# Patient Record
Sex: Male | Born: 1965
Health system: Southern US, Community
[De-identification: ages and names within clinical notes are randomized; demographics above are authoritative.]

## PROBLEM LIST (undated history)

## (undated) DIAGNOSIS — K219 Gastro-esophageal reflux disease without esophagitis: Secondary | ICD-10-CM

## (undated) DIAGNOSIS — I10 Essential (primary) hypertension: Secondary | ICD-10-CM

## (undated) DIAGNOSIS — E669 Obesity, unspecified: Secondary | ICD-10-CM

## (undated) HISTORY — PX: LAPAROSCOPIC GASTRIC SLEEVE RESECTION: SHX5895

## (undated) HISTORY — PX: TONSILECTOMY, ADENOIDECTOMY, BILATERAL MYRINGOTOMY AND TUBES: SHX2538

## (undated) HISTORY — DX: Obesity, unspecified: E66.9

## (undated) HISTORY — DX: Essential (primary) hypertension: I10

---

## 2017-01-06 DIAGNOSIS — Z131 Encounter for screening for diabetes mellitus: Secondary | ICD-10-CM | POA: Diagnosis not present

## 2017-01-06 DIAGNOSIS — Z23 Encounter for immunization: Secondary | ICD-10-CM | POA: Diagnosis not present

## 2017-01-06 DIAGNOSIS — Z1211 Encounter for screening for malignant neoplasm of colon: Secondary | ICD-10-CM | POA: Diagnosis not present

## 2017-01-06 DIAGNOSIS — Z0000 Encounter for general adult medical examination without abnormal findings: Secondary | ICD-10-CM | POA: Diagnosis not present

## 2017-01-06 DIAGNOSIS — Z125 Encounter for screening for malignant neoplasm of prostate: Secondary | ICD-10-CM | POA: Diagnosis not present

## 2017-01-27 DIAGNOSIS — D485 Neoplasm of uncertain behavior of skin: Secondary | ICD-10-CM | POA: Diagnosis not present

## 2017-01-27 DIAGNOSIS — D225 Melanocytic nevi of trunk: Secondary | ICD-10-CM | POA: Diagnosis not present

## 2017-11-11 DIAGNOSIS — K080 Exfoliation of teeth due to systemic causes: Secondary | ICD-10-CM | POA: Diagnosis not present

## 2017-11-16 DIAGNOSIS — K080 Exfoliation of teeth due to systemic causes: Secondary | ICD-10-CM | POA: Diagnosis not present

## 2018-04-10 DIAGNOSIS — E669 Obesity, unspecified: Secondary | ICD-10-CM | POA: Diagnosis not present

## 2018-04-10 DIAGNOSIS — Z131 Encounter for screening for diabetes mellitus: Secondary | ICD-10-CM | POA: Diagnosis not present

## 2018-04-10 DIAGNOSIS — Z0000 Encounter for general adult medical examination without abnormal findings: Secondary | ICD-10-CM | POA: Diagnosis not present

## 2018-04-10 DIAGNOSIS — Z125 Encounter for screening for malignant neoplasm of prostate: Secondary | ICD-10-CM | POA: Diagnosis not present

## 2018-04-10 DIAGNOSIS — Z13220 Encounter for screening for lipoid disorders: Secondary | ICD-10-CM | POA: Diagnosis not present

## 2018-04-26 DIAGNOSIS — K921 Melena: Secondary | ICD-10-CM | POA: Diagnosis not present

## 2018-05-12 DIAGNOSIS — K080 Exfoliation of teeth due to systemic causes: Secondary | ICD-10-CM | POA: Diagnosis not present

## 2018-06-06 DIAGNOSIS — K219 Gastro-esophageal reflux disease without esophagitis: Secondary | ICD-10-CM | POA: Diagnosis not present

## 2018-06-06 DIAGNOSIS — Z1211 Encounter for screening for malignant neoplasm of colon: Secondary | ICD-10-CM | POA: Diagnosis not present

## 2018-06-06 DIAGNOSIS — K259 Gastric ulcer, unspecified as acute or chronic, without hemorrhage or perforation: Secondary | ICD-10-CM | POA: Diagnosis not present

## 2018-06-06 DIAGNOSIS — Z9884 Bariatric surgery status: Secondary | ICD-10-CM | POA: Diagnosis not present

## 2018-06-06 DIAGNOSIS — K449 Diaphragmatic hernia without obstruction or gangrene: Secondary | ICD-10-CM | POA: Diagnosis not present

## 2018-06-06 DIAGNOSIS — K208 Other esophagitis: Secondary | ICD-10-CM | POA: Diagnosis not present

## 2018-06-06 DIAGNOSIS — K210 Gastro-esophageal reflux disease with esophagitis: Secondary | ICD-10-CM | POA: Diagnosis not present

## 2018-06-06 DIAGNOSIS — K2970 Gastritis, unspecified, without bleeding: Secondary | ICD-10-CM | POA: Diagnosis not present

## 2018-06-06 DIAGNOSIS — K5730 Diverticulosis of large intestine without perforation or abscess without bleeding: Secondary | ICD-10-CM | POA: Diagnosis not present

## 2018-06-06 DIAGNOSIS — K2900 Acute gastritis without bleeding: Secondary | ICD-10-CM | POA: Diagnosis not present

## 2018-06-06 LAB — HM COLONOSCOPY

## 2018-07-19 DIAGNOSIS — K219 Gastro-esophageal reflux disease without esophagitis: Secondary | ICD-10-CM | POA: Diagnosis not present

## 2018-12-13 DIAGNOSIS — Z23 Encounter for immunization: Secondary | ICD-10-CM | POA: Diagnosis not present

## 2019-05-28 DIAGNOSIS — R05 Cough: Secondary | ICD-10-CM | POA: Diagnosis not present

## 2019-05-28 DIAGNOSIS — R509 Fever, unspecified: Secondary | ICD-10-CM | POA: Diagnosis not present

## 2019-05-28 DIAGNOSIS — J189 Pneumonia, unspecified organism: Secondary | ICD-10-CM | POA: Diagnosis not present

## 2019-08-02 DIAGNOSIS — M461 Sacroiliitis, not elsewhere classified: Secondary | ICD-10-CM | POA: Diagnosis not present

## 2019-08-02 DIAGNOSIS — M9905 Segmental and somatic dysfunction of pelvic region: Secondary | ICD-10-CM | POA: Diagnosis not present

## 2019-08-02 DIAGNOSIS — M9903 Segmental and somatic dysfunction of lumbar region: Secondary | ICD-10-CM | POA: Diagnosis not present

## 2019-08-02 DIAGNOSIS — M5387 Other specified dorsopathies, lumbosacral region: Secondary | ICD-10-CM | POA: Diagnosis not present

## 2019-08-09 DIAGNOSIS — M5387 Other specified dorsopathies, lumbosacral region: Secondary | ICD-10-CM | POA: Diagnosis not present

## 2019-08-09 DIAGNOSIS — M9905 Segmental and somatic dysfunction of pelvic region: Secondary | ICD-10-CM | POA: Diagnosis not present

## 2019-08-09 DIAGNOSIS — M461 Sacroiliitis, not elsewhere classified: Secondary | ICD-10-CM | POA: Diagnosis not present

## 2019-08-09 DIAGNOSIS — M9903 Segmental and somatic dysfunction of lumbar region: Secondary | ICD-10-CM | POA: Diagnosis not present

## 2019-12-27 ENCOUNTER — Encounter: Payer: Self-pay | Admitting: Legal Medicine

## 2019-12-27 ENCOUNTER — Ambulatory Visit: Payer: Federal, State, Local not specified - PPO | Admitting: Legal Medicine

## 2019-12-27 ENCOUNTER — Ambulatory Visit: Payer: Self-pay | Admitting: Family Medicine

## 2019-12-27 ENCOUNTER — Other Ambulatory Visit: Payer: Self-pay

## 2019-12-27 VITALS — BP 180/100 | HR 81 | Temp 97.4°F | Ht 69.29 in | Wt 227.0 lb

## 2019-12-27 DIAGNOSIS — Z6841 Body Mass Index (BMI) 40.0 and over, adult: Secondary | ICD-10-CM

## 2019-12-27 DIAGNOSIS — Z0000 Encounter for general adult medical examination without abnormal findings: Secondary | ICD-10-CM | POA: Insufficient documentation

## 2019-12-27 DIAGNOSIS — I10 Essential (primary) hypertension: Secondary | ICD-10-CM

## 2019-12-27 DIAGNOSIS — E291 Testicular hypofunction: Secondary | ICD-10-CM | POA: Diagnosis not present

## 2019-12-27 MED ORDER — LISINOPRIL 20 MG PO TABS: 20.0000 mg | ORAL_TABLET | Freq: Every day | ORAL | 3 refills | Status: DC

## 2019-12-27 NOTE — Assessment & Plan Note (Signed)

## 2019-12-27 NOTE — Patient Instructions (Signed)

## 2019-12-27 NOTE — Progress Notes (Signed)
Established Patient Office Visit  Subjective:  Patient ID: Tyrone Wilson, male    DOB: [birth year]  Age: 54 y.o. MRN: [id number]  CC:  Chief Complaint  Patient presents with  . Annual Exam    HPI Tyrone Wilson presents for CPE.  Physical: patient has a long history of hypertension and is off medicines.   He is having some dizziness.  No headaches.  No CAD or chest pain, no stoke.  He has gastric sleeve for obesity.  He had pneumonia in the past and had GI bleed from esophagus.  He is in Event organiser.  No hospitalization. Retired.  Past Medical History:  Diagnosis Date  . Hypertension   . Obesity     Past Surgical History:  Procedure Laterality Date  . LAPAROSCOPIC GASTRIC SLEEVE RESECTION    . TONSILECTOMY, ADENOIDECTOMY, BILATERAL MYRINGOTOMY AND TUBES      Family History  Problem Relation Age of Onset  . Hypertension Mother   . Diabetes Mother   . Hypertension Father   . Cancer Father   . Cancer Brother     Social History   Socioeconomic History  . Marital status: Married    Spouse name: Not on file  . Number of children: Not on file  . Years of education: Not on file  . Highest education level: Not on file  Occupational History  . Occupation: retired  Tobacco Use  . Smoking status: Never Smoker  . Smokeless tobacco: Never Used  Substance and Sexual Activity  . Alcohol use: Yes    Comment: very little  . Drug use: Never  . Sexual activity: Not on file  Other Topics Concern  . Not on file  Social History Narrative  . Not on file   Social Determinants of Health   Financial Resource Strain:   . Difficulty of Paying Living Expenses:   Food Insecurity:   . Worried About Charity fundraiser in the Last Year:   . Arboriculturist in the Last Year:   Transportation Needs:   . Film/video editor (Medical):   Marland Kitchen Lack of Transportation (Non-Medical):   Physical Activity:   . Days of Exercise per Week:   . Minutes of Exercise per Session:     Stress:   . Feeling of Stress :   Social Connections:   . Frequency of Communication with Friends and Family:   . Frequency of Social Gatherings with Friends and Family:   . Attends Religious Services:   . Active Member of Clubs or Organizations:   . Attends Archivist Meetings:   Marland Kitchen Marital Status:   Intimate Partner Violence:   . Fear of Current or Ex-Partner:   . Emotionally Abused:   Marland Kitchen Physically Abused:   . Sexually Abused:     Outpatient Medications Prior to Visit  Medication Sig Dispense Refill  . esomeprazole (NEXIUM) 20 MG capsule Take 20 mg by mouth daily at 12 noon.    . Omega-3 Fatty Acids (FISH OIL) 1000 MG CAPS Take by mouth.    . Probiotic Product (PROBIOTIC DAILY PO) Take by mouth.     No facility-administered medications prior to visit.    No Known Allergies  ROS Review of Systems  Constitutional: Negative.   HENT: Negative.   Eyes: Negative.   Respiratory: Negative.   Cardiovascular: Negative.   Gastrointestinal: Negative.   Endocrine: Negative.   Genitourinary: Negative.   Musculoskeletal: Negative.   Skin: Negative.   Neurological: Negative.  Psychiatric/Behavioral: Negative.       Objective:    Physical Exam  Constitutional: He is oriented to person, place, and time. He appears well-developed and well-nourished.  HENT:  Head: Normocephalic and atraumatic.  Eyes: Pupils are equal, round, and reactive to light. Conjunctivae and EOM are normal.  Cardiovascular: Normal rate, regular rhythm and normal heart sounds.  Pulmonary/Chest: Effort normal and breath sounds normal.  Abdominal: Soft. Bowel sounds are normal.  Genitourinary:    Prostate, penis and rectum normal.   Musculoskeletal:        General: Normal range of motion.     Cervical back: Normal range of motion and neck supple.  Neurological: He is alert and oriented to person, place, and time. He has normal reflexes.  Skin: Skin is warm and dry.  Psychiatric: He has a  normal mood and affect.  Vitals reviewed.   BP (!) 180/100   Pulse 81   Temp (!) 97.4 F (36.3 C)   Ht 5' 9.29" (1.76 m)   Wt 227 lb (103 kg)   SpO2 94%   BMI 33.24 kg/m  Wt Readings from Last 3 Encounters:  12/27/19 227 lb (103 kg)     Health Maintenance Due  Topic Date Due  . HIV Screening  Never done  . TETANUS/TDAP  Never done  . COLONOSCOPY  Never done  . INFLUENZA VACCINE  Never done    There are no preventive care reminders to display for this patient.  No results found for: TSH No results found for: WBC, HGB, HCT, MCV, PLT No results found for: NA, K, CHLORIDE, CO2, GLUCOSE, BUN, CREATININE, BILITOT, ALKPHOS, AST, ALT, PROT, ALBUMIN, CALCIUM, ANIONGAP, EGFR, GFR No results found for: CHOL No results found for: HDL No results found for: LDLCALC No results found for: TRIG No results found for: CHOLHDL No results found for: HGBA1C    Assessment & Plan:   Problem List Items Addressed This Visit      Cardiovascular and Mediastinum   Benign hypertension    An individual care plan was established and reinforced today.  The patient's status was assessed using clinical findings on exam and labs or diagnostic tests. The patient's success at meeting treatment goals on disease specific evidence-based guidelines and found to be well controlled. SELF MANAGEMENT: The patient and I together assessed ways to personally work towards obtaining the recommended goals. RECOMMENDATIONS: avoid decongestants found in common cold remedies, decrease consumption of alcohol, perform routine monitoring of BP with home BP cuff, exercise, reduction of dietary salt, take medicines as prescribed, try not to miss doses and quit smoking.  Regular exercise and maintaining a healthy weight is needed.  Stress reduction may help. A CLINICAL SUMMARY including written plan identify barriers to care unique to individual due to social or financial issues.  We attempt to mutually creat solutions for  individual and family understanding.      Relevant Medications   lisinopril (ZESTRIL) 20 MG tablet   Other Relevant Orders   CBC with Differential   Comprehensive metabolic panel     Other   BMI 40.0-44.9, adult [facility])    An individualize plan was formulated using patient history and physical exam to encourage weight loss.  An evidence based program was formulated.  Patient is to cut portion size with meals and to plan physical exercise 3 days a week at least 20 minutes.  Weight watchers and other programs are helpful.  Planned amount of weight loss 20 lbs.  Relevant Orders   Lipid Panel   PSA   Routine general medical examination at a health care facility    Patient is doing well and physical is normal.  Forms completed for work.       Other Visit Diagnoses    Hypogonadism in male    -  Primary   Relevant Orders   Testosterone, free      Meds ordered this encounter  Medications  . lisinopril (ZESTRIL) 20 MG tablet    Sig: Take 1 tablet (20 mg total) by mouth daily.    Dispense:  90 tablet    Refill:  3    Follow-up: Return in about 2 weeks (around 01/10/2020), or nurse visit.Reinaldo Meeker, MD

## 2019-12-27 NOTE — Assessment & Plan Note (Signed)
Patient is doing well and physical is normal.  Forms completed for work.

## 2019-12-27 NOTE — Assessment & Plan Note (Signed)
An individualize plan was formulated using patient history and physical exam to encourage weight loss.  An evidence based program was formulated.  Patient is to cut portion size with meals and to plan physical exercise 3 days a week at least 20 minutes.  Weight watchers and other programs are helpful.  Planned amount of weight loss 20 lbs. 

## 2019-12-28 ENCOUNTER — Other Ambulatory Visit: Payer: Self-pay | Admitting: Legal Medicine

## 2019-12-28 DIAGNOSIS — E782 Mixed hyperlipidemia: Secondary | ICD-10-CM

## 2019-12-28 LAB — COMPREHENSIVE METABOLIC PANEL
ALT: 13 IU/L (ref 0–44)
AST: 22 IU/L (ref 0–40)
Albumin/Globulin Ratio: 1.9 (ref 1.2–2.2)
Albumin: 4.6 g/dL (ref 3.8–4.9)
Alkaline Phosphatase: 160 IU/L — ABNORMAL HIGH (ref 39–117)
BUN/Creatinine Ratio: 9 (ref 9–20)
BUN: 11 mg/dL (ref 6–24)
Bilirubin Total: 0.6 mg/dL (ref 0.0–1.2)
CO2: 26 mmol/L (ref 20–29)
Calcium: 9.1 mg/dL (ref 8.7–10.2)
Chloride: 104 mmol/L (ref 96–106)
Creatinine, Ser: 1.17 mg/dL (ref 0.76–1.27)
GFR calc Af Amer: 81 mL/min/{1.73_m2} (ref >59)
GFR calc non Af Amer: 70 mL/min/{1.73_m2} (ref >59)
Globulin, Total: 2.4 g/dL (ref 1.5–4.5)
Glucose: 89 mg/dL (ref 65–99)
Potassium: 4.6 mmol/L (ref 3.5–5.2)
Sodium: 143 mmol/L (ref 134–144)
Total Protein: 7 g/dL (ref 6.0–8.5)

## 2019-12-28 LAB — CBC WITH DIFFERENTIAL/PLATELET
Basophils Absolute: 0.1 10*3/uL (ref 0.0–0.2)
Basos: 1 %
EOS (ABSOLUTE): 0.2 10*3/uL (ref 0.0–0.4)
Eos: 3 %
Hematocrit: 45.7 % (ref 37.5–51.0)
Hemoglobin: 15.8 g/dL (ref 13.0–17.7)
Immature Grans (Abs): 0 10*3/uL (ref 0.0–0.1)
Immature Granulocytes: 0 %
Lymphocytes Absolute: 1.7 10*3/uL (ref 0.7–3.1)
Lymphs: 27 %
MCH: 30.9 pg (ref 26.6–33.0)
MCHC: 34.6 g/dL (ref 31.5–35.7)
MCV: 89 fL (ref 79–97)
Monocytes Absolute: 0.5 10*3/uL (ref 0.1–0.9)
Monocytes: 7 %
Neutrophils Absolute: 3.8 10*3/uL (ref 1.4–7.0)
Neutrophils: 62 %
Platelets: 210 10*3/uL (ref 150–450)
RBC: 5.12 x10E6/uL (ref 4.14–5.80)
RDW: 12.7 % (ref 11.6–15.4)
WBC: 6.2 10*3/uL (ref 3.4–10.8)

## 2019-12-28 LAB — LIPID PANEL
Chol/HDL Ratio: 3.8 ratio (ref 0.0–5.0)
Cholesterol, Total: 214 mg/dL — ABNORMAL HIGH (ref 100–199)
HDL: 56 mg/dL (ref >39)
LDL Chol Calc (NIH): 138 mg/dL — ABNORMAL HIGH (ref 0–99)
Triglycerides: 111 mg/dL (ref 0–149)
VLDL Cholesterol Cal: 20 mg/dL (ref 5–40)

## 2019-12-28 LAB — CARDIOVASCULAR RISK ASSESSMENT

## 2019-12-28 LAB — PSA: Prostate Specific Ag, Serum: 0.4 ng/mL (ref 0.0–4.0)

## 2019-12-28 MED ORDER — ROSUVASTATIN CALCIUM 20 MG PO TABS: 20.0000 mg | ORAL_TABLET | Freq: Every day | ORAL | 2 refills | Status: DC

## 2019-12-28 NOTE — Progress Notes (Signed)
CBC normal, Kidney and liver tests normal, Cholesterol very high LDL 138 strongly recommend low cholesterol diet (DASH) and starting statin to lower cardiac risk, PSA 0.4 lp

## 2019-12-31 LAB — TESTOSTERONE, FREE: Testosterone, Free: 8.4 pg/mL (ref 7.2–24.0)

## 2019-12-31 NOTE — Progress Notes (Signed)
Testosterone is 8.4 in normal range lp

## 2020-01-10 ENCOUNTER — Ambulatory Visit (INDEPENDENT_AMBULATORY_CARE_PROVIDER_SITE_OTHER): Payer: Federal, State, Local not specified - PPO

## 2020-01-10 ENCOUNTER — Other Ambulatory Visit: Payer: Self-pay | Admitting: Family Medicine

## 2020-01-10 ENCOUNTER — Other Ambulatory Visit: Payer: Self-pay

## 2020-01-10 VITALS — BP 172/110

## 2020-01-10 DIAGNOSIS — I10 Essential (primary) hypertension: Secondary | ICD-10-CM | POA: Diagnosis not present

## 2020-01-10 DIAGNOSIS — E291 Testicular hypofunction: Secondary | ICD-10-CM

## 2020-01-10 LAB — COMPREHENSIVE METABOLIC PANEL
ALT: 19 IU/L (ref 0–44)
AST: 27 IU/L (ref 0–40)
Albumin/Globulin Ratio: 2.3 — ABNORMAL HIGH (ref 1.2–2.2)
Albumin: 4.4 g/dL (ref 3.8–4.9)
Alkaline Phosphatase: 142 IU/L — ABNORMAL HIGH (ref 39–117)
BUN/Creatinine Ratio: 12 (ref 9–20)
BUN: 14 mg/dL (ref 6–24)
Bilirubin Total: 0.7 mg/dL (ref 0.0–1.2)
CO2: 24 mmol/L (ref 20–29)
Calcium: 8.9 mg/dL (ref 8.7–10.2)
Chloride: 107 mmol/L — ABNORMAL HIGH (ref 96–106)
Creatinine, Ser: 1.16 mg/dL (ref 0.76–1.27)
GFR calc Af Amer: 82 mL/min/{1.73_m2} (ref >59)
GFR calc non Af Amer: 71 mL/min/{1.73_m2} (ref >59)
Globulin, Total: 1.9 g/dL (ref 1.5–4.5)
Glucose: 95 mg/dL (ref 65–99)
Potassium: 4.3 mmol/L (ref 3.5–5.2)
Sodium: 144 mmol/L (ref 134–144)
Total Protein: 6.3 g/dL (ref 6.0–8.5)

## 2020-01-10 MED ORDER — TESTOSTERONE CYPIONATE 200 MG/ML IM SOLN: 250.0000 mg | Freq: Once | INTRAMUSCULAR | 0 refills | Status: DC

## 2020-01-10 MED ORDER — "SYRINGE 23G X 1"" 3 ML MISC": 1.0000 | 0 refills | Status: DC

## 2020-01-10 NOTE — Progress Notes (Signed)
Increase lisinopril to 20 mg one twice a day.  Check cmp.  Follow up in 3 weeks.

## 2020-01-14 ENCOUNTER — Telehealth: Payer: Self-pay

## 2020-01-14 NOTE — Telephone Encounter (Signed)
Patient's prior approval for depo-testosterone 200 mg/ml and the authorization is valid from 12/12/2019 to 01/10/2021.

## 2020-01-15 ENCOUNTER — Telehealth: Payer: Self-pay

## 2020-01-15 NOTE — Telephone Encounter (Signed)
PA for Testosterone submitted and approved via covermymeds.

## 2020-01-29 NOTE — Progress Notes (Signed)
Subjective:  Patient ID: Tyrone Wilson, male    DOB: 1966-01-29  Age: 54 y.o. MRN: [id number]  Chief Complaint  Patient presents with  . Hypertension    HPI Patient presents in follow up of hypertension. He is taking zestril 20 mg once daily. His bp has continued to be elevated although improved. He is tolerating zestril and is compliant with taking the medicines.   Patient had gastric bypass in 2014 and lost 100 lbs initially. He has gained 14 lbs. He is eating healthy, but not exercising.    Past Medical History:  Diagnosis Date  . Hypertension   . Obesity    Past Surgical History:  Procedure Laterality Date  . LAPAROSCOPIC GASTRIC SLEEVE RESECTION    . TONSILECTOMY, ADENOIDECTOMY, BILATERAL MYRINGOTOMY AND TUBES      Family History  Problem Relation Age of Onset  . Hypertension Mother   . Diabetes Mother   . Hypertension Father   . Cancer Father   . Cancer Brother    Social History   Socioeconomic History  . Marital status: Married    Spouse name: Not on file  . Number of children: Not on file  . Years of education: Not on file  . Highest education level: Not on file  Occupational History  . Occupation: retired  Tobacco Use  . Smoking status: Never Smoker  . Smokeless tobacco: Never Used  Substance and Sexual Activity  . Alcohol use: Yes    Comment: very little  . Drug use: Never  . Sexual activity: Not on file  Other Topics Concern  . Not on file  Social History Narrative  . Not on file   Social Determinants of Health   Financial Resource Strain:   . Difficulty of Paying Living Expenses:   Food Insecurity:   . Worried About Charity fundraiser in the Last Year:   . Arboriculturist in the Last Year:   Transportation Needs:   . Film/video editor (Medical):   Marland Kitchen Lack of Transportation (Non-Medical):   Physical Activity:   . Days of Exercise per Week:   . Minutes of Exercise per Session:   Stress:   . Feeling of Stress :   Social  Connections:   . Frequency of Communication with Friends and Family:   . Frequency of Social Gatherings with Friends and Family:   . Attends Religious Services:   . Active Member of Clubs or Organizations:   . Attends Archivist Meetings:   Marland Kitchen Marital Status:     Review of Systems  Constitutional: Negative for chills, diaphoresis, fatigue and fever.  HENT: Negative for congestion, ear pain and sore throat.   Respiratory: Negative for cough and shortness of breath.   Cardiovascular: Negative for chest pain and leg swelling.  Gastrointestinal: Negative for abdominal pain, constipation, diarrhea, nausea and vomiting.  Genitourinary: Negative for dysuria and urgency.  Musculoskeletal: Negative for arthralgias and myalgias.  Neurological: Negative for dizziness and headaches.  Psychiatric/Behavioral: Negative for dysphoric mood.     Objective:  BP (!) 170/90   Pulse 82   Temp 98.4 F (36.9 C)   Resp 16   Ht 5\' 10"  (1.778 m)   Wt 232 lb 6.4 oz (105.4 kg)   SpO2 98%   BMI 33.35 kg/m   BP/Weight 01/31/2020 01/10/2020 [id number]  Systolic BP 123XX123 [id number]  Diastolic BP 90 [id number]  Wt. (Lbs) 232.4 - 227  BMI 33.35 - 33.24  Physical Exam Constitutional:      Appearance: Normal appearance.  Cardiovascular:     Rate and Rhythm: Normal rate and regular rhythm.     Pulses: Normal pulses.     Heart sounds: Normal heart sounds.  Pulmonary:     Effort: Pulmonary effort is normal. No respiratory distress.     Breath sounds: Normal breath sounds.  Abdominal:     General: Bowel sounds are normal.     Palpations: Abdomen is soft.  Skin:    General: Skin is dry.  Neurological:     Mental Status: He is alert and oriented to person, place, and time.  Psychiatric:        Mood and Affect: Mood normal.        Behavior: Behavior normal.     Lab Results  Component Value Date   WBC 6.2 12/27/2019   HGB 15.8 12/27/2019   HCT 45.7 12/27/2019   PLT 210 12/27/2019   GLUCOSE 95  01/10/2020   CHOL 214 (H) 12/27/2019   TRIG 111 12/27/2019   HDL 56 12/27/2019   LDLCALC 138 (H) 12/27/2019   ALT 19 01/10/2020   AST 27 01/10/2020   NA 144 01/10/2020   K 4.3 01/10/2020   CL 107 (H) 01/10/2020   CREATININE 1.16 01/10/2020   BUN 14 01/10/2020   CO2 24 01/10/2020      Assessment & Plan:  1. Essential hypertension, benign Start diltiazem er 120 mg once daily in am.  Continue to work on diet and exercise  2. Other testicular dysfunction Shot given today testosterone cytopionate 200 mg/ml. 1 ml given.    Meds ordered this encounter  Medications  . diltiazem (CARDIZEM LA) 120 MG 24 hr tablet    Sig: Take 1 tablet (120 mg total) by mouth daily.    Dispense:  30 tablet    Refill:  1   Follow-up: Return in about 4 weeks (around 02/28/2020) for hypertension.  An After Visit Summary was printed and given to the patient.  Rochel Brome [facility] [phone]

## 2020-01-31 ENCOUNTER — Encounter: Payer: Self-pay | Admitting: Family Medicine

## 2020-01-31 ENCOUNTER — Ambulatory Visit: Payer: Federal, State, Local not specified - PPO | Admitting: Family Medicine

## 2020-01-31 ENCOUNTER — Other Ambulatory Visit: Payer: Self-pay

## 2020-01-31 VITALS — BP 170/90 | HR 82 | Temp 98.4°F | Resp 16 | Ht 70.0 in | Wt 232.4 lb

## 2020-01-31 DIAGNOSIS — I10 Essential (primary) hypertension: Secondary | ICD-10-CM

## 2020-01-31 DIAGNOSIS — E298 Other testicular dysfunction: Secondary | ICD-10-CM

## 2020-01-31 MED ORDER — TESTOSTERONE CYPIONATE 200 MG/ML IM SOLN
250.0000 mg | Freq: Once | INTRAMUSCULAR | Status: AC
  Administered 2020-01-31: 250 mg via INTRAMUSCULAR

## 2020-01-31 MED ORDER — DILTIAZEM HCL ER COATED BEADS 120 MG PO TB24: 120.0000 mg | ORAL_TABLET | Freq: Every day | ORAL | 1 refills | Status: DC

## 2020-01-31 NOTE — Patient Instructions (Signed)
Start on diltiaze er 120 mg once daily in am.  Check bp and pulse daily.  If still high, in 2 weeks, may call and we can make an adjustment.

## 2020-02-04 ENCOUNTER — Other Ambulatory Visit: Payer: Self-pay | Admitting: Family Medicine

## 2020-02-04 ENCOUNTER — Other Ambulatory Visit: Payer: Self-pay

## 2020-02-04 ENCOUNTER — Telehealth: Payer: Self-pay

## 2020-02-04 MED ORDER — DILTIAZEM HCL ER 120 MG PO CP24: 120.0000 mg | ORAL_CAPSULE | Freq: Every day | ORAL | 1 refills | Status: DC

## 2020-02-04 NOTE — Telephone Encounter (Signed)
Patient stated that ned BP medication is too expensive, and would like to try something else if possible.

## 2020-02-04 NOTE — Progress Notes (Signed)
Changed diltiazem (cardizem LA) to diltaizem (dilacor XR) 120 mg capsules once daily. DR. Tobie Poet

## 2020-02-04 NOTE — Telephone Encounter (Signed)
Please call his pharmacy and see if they have a less expensive form of diltiazem er 20 mg once daily in.  Let me know. Thank you. Dr. Tobie Poet

## 2020-02-27 ENCOUNTER — Ambulatory Visit: Payer: Federal, State, Local not specified - PPO | Admitting: Family Medicine

## 2020-03-11 NOTE — Progress Notes (Signed)
Subjective:  Patient ID: Tyrone Wilson, male    DOB: Jan 13, 1966  Age: 54 y.o. MRN: [id number]  Chief Complaint  Patient presents with   Hypertension    HPI Patient presents in follow up of hypertension. He is taking Diltiazem XR 120 mg once daily. Unable to take zestril because it caused palpitations. At home his bp is 120-130s/70s. Eating healthy and exercising.   For his high cholesterol he is taking rosuvastatin. Eating low fat diet and exercising. Tolerating rosuvastatin.   Past Medical History:  Diagnosis Date   Hypertension    Obesity    Past Surgical History:  Procedure Laterality Date   LAPAROSCOPIC GASTRIC SLEEVE RESECTION     TONSILECTOMY, ADENOIDECTOMY, BILATERAL MYRINGOTOMY AND TUBES      Family History  Problem Relation Age of Onset   Hypertension Mother    Diabetes Mother    Hypertension Father    Cancer Father    Cancer Brother    Social History   Socioeconomic History   Marital status: Married    Spouse name: Not on file   Number of children: Not on file   Years of education: Not on file   Highest education level: Not on file  Occupational History   Occupation: retired  Tobacco Use   Smoking status: Never Smoker   Smokeless tobacco: Never Used  Scientific laboratory technician Use: Never used  Substance and Sexual Activity   Alcohol use: Yes    Comment: very little   Drug use: Never   Sexual activity: Not on file  Other Topics Concern   Not on file  Social History Narrative   Not on file   Social Determinants of Health   Financial Resource Strain:    Difficulty of Paying Living Expenses:   Food Insecurity:    Worried About Charity fundraiser in the Last Year:    Arboriculturist in the Last Year:   Transportation Needs:    Film/video editor (Medical):    Lack of Transportation (Non-Medical):   Physical Activity:    Days of Exercise per Week:    Minutes of Exercise per Session:   Stress:    Feeling of Stress  :   Social Connections:    Frequency of Communication with Friends and Family:    Frequency of Social Gatherings with Friends and Family:    Attends Religious Services:    Active Member of Clubs or Organizations:    Attends Archivist Meetings:    Marital Status:     Review of Systems  Constitutional: Positive for fatigue. Negative for chills and fever.  HENT: Negative for congestion, ear pain and sore throat.   Respiratory: Negative for cough and shortness of breath.   Cardiovascular: Negative for chest pain, palpitations and leg swelling.  Gastrointestinal: Negative for abdominal pain, constipation, diarrhea, nausea and vomiting.  Genitourinary: Negative for dysuria and urgency.  Musculoskeletal: Negative for arthralgias and myalgias.  Neurological: Negative for dizziness and headaches.  Psychiatric/Behavioral: Negative for dysphoric mood.    Objective:  BP (!) 144/96    Pulse 94    Temp 97.6 F (36.4 C)    Resp (!) 94    Ht 5\' 10"  (1.778 m)    Wt 231 lb 3.2 oz (104.9 kg)    BMI 33.17 kg/m   BP/Weight 03/12/2020 01/10/8118 10/08/7827  Systolic BP 562 130 865  Diastolic BP 96 90 784  Wt. (Lbs) 231.2 232.4 -  BMI 33.17 33.35 -  Physical Exam Constitutional:      Appearance: Normal appearance.  Cardiovascular:     Rate and Rhythm: Normal rate and regular rhythm.     Pulses: Normal pulses.     Heart sounds: Normal heart sounds.  Pulmonary:     Effort: Pulmonary effort is normal. No respiratory distress.     Breath sounds: Normal breath sounds.  Abdominal:     General: Bowel sounds are normal.     Palpations: Abdomen is soft.  Skin:    General: Skin is dry.  Neurological:     Mental Status: He is alert and oriented to person, place, and time.  Psychiatric:        Mood and Affect: Mood normal.        Behavior: Behavior normal.     Lab Results  Component Value Date   WBC 6.2 12/27/2019   HGB 15.8 12/27/2019   HCT 45.7 12/27/2019   PLT 210  12/27/2019   GLUCOSE 95 01/10/2020   CHOL 214 (H) 12/27/2019   TRIG 111 12/27/2019   HDL 56 12/27/2019   LDLCALC 138 (H) 12/27/2019   ALT 19 01/10/2020   AST 27 01/10/2020   NA 144 01/10/2020   K 4.3 01/10/2020   CL 107 (H) 01/10/2020   CREATININE 1.16 01/10/2020   BUN 14 01/10/2020   CO2 24 01/10/2020      Assessment & Plan:  1. Essential hypertension, benign Continue to check bp at home. If is elevated, pt to call and get a nurse visit to compare his bp cuff to ours.  Continue diltiazem er 120 mg once daily in am.  Continue to work on diet and exercise  2. Hyperlipidemia -  Well controlled.  No changes to medicines.  Continue to work on eating a healthy diet and exercise.  Labs drawn today.    Follow-up: Return in about 2 months (around 05/19/2020) for fasting,.  An After Visit Summary was printed and given to the patient.  Rochel Brome [facility] [phone]

## 2020-03-12 ENCOUNTER — Encounter: Payer: Self-pay | Admitting: Family Medicine

## 2020-03-12 ENCOUNTER — Ambulatory Visit: Payer: Federal, State, Local not specified - PPO | Admitting: Family Medicine

## 2020-03-12 ENCOUNTER — Other Ambulatory Visit: Payer: Self-pay

## 2020-03-12 VITALS — BP 144/96 | HR 94 | Temp 97.6°F | Resp 94 | Ht 70.0 in | Wt 231.2 lb

## 2020-03-12 DIAGNOSIS — E782 Mixed hyperlipidemia: Secondary | ICD-10-CM

## 2020-03-12 DIAGNOSIS — I10 Essential (primary) hypertension: Secondary | ICD-10-CM

## 2020-03-16 ENCOUNTER — Encounter: Payer: Self-pay | Admitting: Family Medicine

## 2020-03-19 ENCOUNTER — Ambulatory Visit (INDEPENDENT_AMBULATORY_CARE_PROVIDER_SITE_OTHER): Payer: Federal, State, Local not specified - PPO

## 2020-03-19 ENCOUNTER — Other Ambulatory Visit: Payer: Self-pay | Admitting: Family Medicine

## 2020-03-19 ENCOUNTER — Other Ambulatory Visit: Payer: Self-pay

## 2020-03-19 VITALS — BP 162/110 | HR 74

## 2020-03-19 DIAGNOSIS — I10 Essential (primary) hypertension: Secondary | ICD-10-CM

## 2020-03-19 MED ORDER — DILTIAZEM HCL ER 180 MG PO CP24: 180.0000 mg | ORAL_CAPSULE | Freq: Every day | ORAL | 0 refills | Status: DC

## 2020-03-19 NOTE — Progress Notes (Signed)
      Patient came in today for a BP check, he brought his digital machine as well.  Manual reading is 162/110 Digital machine 165/107 HR 74.  He is currently taking Diltazem 120 mg 24 hour capsule daily.  Dr Tobie Poet increased it to 180 mg daily.  He was instructed to monitor home BP daily and come back in one week for a nurse visit.  Shelle Iron, LPN 14/23/95 3:20 AM

## 2020-04-15 ENCOUNTER — Other Ambulatory Visit: Payer: Self-pay | Admitting: Family Medicine

## 2020-05-12 ENCOUNTER — Other Ambulatory Visit: Payer: Self-pay | Admitting: Family Medicine

## 2020-05-12 ENCOUNTER — Other Ambulatory Visit: Payer: Self-pay

## 2020-05-12 ENCOUNTER — Encounter: Payer: Self-pay | Admitting: Family Medicine

## 2020-05-12 ENCOUNTER — Ambulatory Visit: Payer: Federal, State, Local not specified - PPO | Admitting: Family Medicine

## 2020-05-12 VITALS — BP 170/104 | HR 80 | Temp 97.4°F | Resp 16 | Ht 70.0 in | Wt 231.8 lb

## 2020-05-12 DIAGNOSIS — I10 Essential (primary) hypertension: Secondary | ICD-10-CM

## 2020-05-12 DIAGNOSIS — Z6833 Body mass index (BMI) 33.0-33.9, adult: Secondary | ICD-10-CM

## 2020-05-12 DIAGNOSIS — E782 Mixed hyperlipidemia: Secondary | ICD-10-CM | POA: Diagnosis not present

## 2020-05-12 DIAGNOSIS — E6609 Other obesity due to excess calories: Secondary | ICD-10-CM | POA: Insufficient documentation

## 2020-05-12 MED ORDER — VALSARTAN-HYDROCHLOROTHIAZIDE 160-12.5 MG PO TABS: 1.0000 | ORAL_TABLET | Freq: Every day | ORAL | 0 refills | Status: DC

## 2020-05-12 NOTE — Progress Notes (Deleted)
Established Patient Office Visit  Subjective:  Patient ID: Tyrone Wilson, male    DOB: 10-06-1965  Age: 54 y.o. MRN: [id number]  CC:  Chief Complaint  Patient presents with  . Hypertension    HPI Tyrone Wilson presents for ***  Past Medical History:  Diagnosis Date  . Hypertension   . Obesity     Past Surgical History:  Procedure Laterality Date  . LAPAROSCOPIC GASTRIC SLEEVE RESECTION    . TONSILECTOMY, ADENOIDECTOMY, BILATERAL MYRINGOTOMY AND TUBES      Family History  Problem Relation Age of Onset  . Hypertension Mother   . Diabetes Mother   . Hypertension Father   . Cancer Father   . Cancer Brother     Social History   Socioeconomic History  . Marital status: Married    Spouse name: Not on file  . Number of children: Not on file  . Years of education: Not on file  . Highest education level: Not on file  Occupational History  . Occupation: retired  Tobacco Use  . Smoking status: Never Smoker  . Smokeless tobacco: Never Used  Vaping Use  . Vaping Use: Never used  Substance and Sexual Activity  . Alcohol use: Yes    Comment: very little  . Drug use: Never  . Sexual activity: Not on file  Other Topics Concern  . Not on file  Social History Narrative  . Not on file   Social Determinants of Health   Financial Resource Strain:   . Difficulty of Paying Living Expenses:   Food Insecurity:   . Worried About Charity fundraiser in the Last Year:   . Arboriculturist in the Last Year:   Transportation Needs:   . Film/video editor (Medical):   Marland Kitchen Lack of Transportation (Non-Medical):   Physical Activity:   . Days of Exercise per Week:   . Minutes of Exercise per Session:   Stress:   . Feeling of Stress :   Social Connections:   . Frequency of Communication with Friends and Family:   . Frequency of Social Gatherings with Friends and Family:   . Attends Religious Services:   . Active Member of Clubs or Organizations:   . Attends Theatre manager Meetings:   Marland Kitchen Marital Status:   Intimate Partner Violence:   . Fear of Current or Ex-Partner:   . Emotionally Abused:   Marland Kitchen Physically Abused:   . Sexually Abused:     Outpatient Medications Prior to Visit  Medication Sig Dispense Refill  . esomeprazole (NEXIUM) 20 MG capsule Take 20 mg by mouth daily at 12 noon.    . rosuvastatin (CRESTOR) 20 MG tablet Take 1 tablet (20 mg total) by mouth daily. 90 tablet 2  . Syringe/Needle, Disp, (SYRINGE 3CC/23GX1") 23G X 1" 3 ML MISC 1 each by Does not apply route every 14 (fourteen) days. 50 each 0  . testosterone cypionate (DEPOTESTOSTERONE CYPIONATE) 200 MG/ML injection Inject 1.25 mLs (250 mg total) into the muscle once for 1 dose. 10 mL 0  . diltiazem (DILACOR XR) 180 MG 24 hr capsule TAKE 1 CAPSULE(180 MG) BY MOUTH DAILY 30 capsule 6   No facility-administered medications prior to visit.    No Known Allergies  ROS Review of Systems    Objective:    Physical Exam  BP (!) 170/104   Pulse 80   Temp (!) 97.4 F (36.3 C)   Resp 16   Ht 5\' 10"  (1.778  m)   Wt 231 lb 12.8 oz (105.1 kg)   BMI 33.26 kg/m  Wt Readings from Last 3 Encounters:  05/12/20 231 lb 12.8 oz (105.1 kg)  03/12/20 231 lb 3.2 oz (104.9 kg)  01/31/20 232 lb 6.4 oz (105.4 kg)     Health Maintenance Due  Topic Date Due  . Hepatitis C Screening  Never done  . COVID-19 Vaccine (1) Never done  . HIV Screening  Never done  . TETANUS/TDAP  Never done  . COLONOSCOPY  Never done  . INFLUENZA VACCINE  05/04/2020    There are no preventive care reminders to display for this patient.  No results found for: TSH Lab Results  Component Value Date   WBC 6.2 12/27/2019   HGB 15.8 12/27/2019   HCT 45.7 12/27/2019   MCV 89 12/27/2019   PLT 210 12/27/2019   Lab Results  Component Value Date   NA 144 01/10/2020   K 4.3 01/10/2020   CO2 24 01/10/2020   GLUCOSE 95 01/10/2020   BUN 14 01/10/2020   CREATININE 1.16 01/10/2020   BILITOT 0.7 01/10/2020    ALKPHOS 142 (H) 01/10/2020   AST 27 01/10/2020   ALT 19 01/10/2020   PROT 6.3 01/10/2020   ALBUMIN 4.4 01/10/2020   CALCIUM 8.9 01/10/2020   Lab Results  Component Value Date   CHOL 214 (H) 12/27/2019   Lab Results  Component Value Date   HDL 56 12/27/2019   Lab Results  Component Value Date   LDLCALC 138 (H) 12/27/2019   Lab Results  Component Value Date   TRIG 111 12/27/2019   Lab Results  Component Value Date   CHOLHDL 3.8 12/27/2019   No results found for: HGBA1C    Assessment & Plan:   Problem List Items Addressed This Visit    None      No orders of the defined types were placed in this encounter.   Follow-up: No follow-ups on file.    Effie Shy, RN

## 2020-05-12 NOTE — Patient Instructions (Signed)
Start on valsartan 160/12.5 mg once daily. Continue to work on diet and exercise Check labwork

## 2020-05-12 NOTE — Progress Notes (Signed)
Subjective:  Patient ID: Tyrone Wilson, male    DOB: 03/27/1966  Age: 54 y.o. MRN: [id number]  Chief Complaint  Patient presents with  . Hypertension    Hypertension Pertinent negatives include no chest pain, headaches, palpitations or shortness of breath.   Patient presents in follow up of hypertension. He discontinued Diltiazem XR 120 mg once daily because was not helping his bp and was causing swelling. Unable to take zestril because it caused palpitations. Eating healthy and exercising (increased activity.)  For his high cholesterol he is taking rosuvastatin 20 mg once daily. Eating low fat diet and exercising. Tolerating rosuvastatin.   Past Medical History:  Diagnosis Date  . Hypertension   . Obesity    Past Surgical History:  Procedure Laterality Date  . LAPAROSCOPIC GASTRIC SLEEVE RESECTION    . TONSILECTOMY, ADENOIDECTOMY, BILATERAL MYRINGOTOMY AND TUBES      Family History  Problem Relation Age of Onset  . Hypertension Mother   . Diabetes Mother   . Hypertension Father   . Cancer Father   . Cancer Brother    Social History   Socioeconomic History  . Marital status: Married    Spouse name: Not on file  . Number of children: Not on file  . Years of education: Not on file  . Highest education level: Not on file  Occupational History  . Occupation: retired  Tobacco Use  . Smoking status: Never Smoker  . Smokeless tobacco: Never Used  Vaping Use  . Vaping Use: Never used  Substance and Sexual Activity  . Alcohol use: Yes    Comment: very little  . Drug use: Never  . Sexual activity: Not on file  Other Topics Concern  . Not on file  Social History Narrative  . Not on file   Social Determinants of Health   Financial Resource Strain:   . Difficulty of Paying Living Expenses:   Food Insecurity:   . Worried About Charity fundraiser in the Last Year:   . Arboriculturist in the Last Year:   Transportation Needs:   . Film/video editor (Medical):    Marland Kitchen Lack of Transportation (Non-Medical):   Physical Activity:   . Days of Exercise per Week:   . Minutes of Exercise per Session:   Stress:   . Feeling of Stress :   Social Connections:   . Frequency of Communication with Friends and Family:   . Frequency of Social Gatherings with Friends and Family:   . Attends Religious Services:   . Active Member of Clubs or Organizations:   . Attends Archivist Meetings:   Marland Kitchen Marital Status:     Review of Systems  Constitutional: Positive for fatigue. Negative for chills and fever.  HENT: Negative for congestion, ear pain and sore throat.   Respiratory: Negative for cough and shortness of breath.   Cardiovascular: Negative for chest pain, palpitations and leg swelling.  Gastrointestinal: Negative for abdominal pain, constipation, diarrhea, nausea and vomiting.  Genitourinary: Negative for dysuria and urgency.  Musculoskeletal: Negative for arthralgias and myalgias.  Neurological: Negative for dizziness and headaches.  Psychiatric/Behavioral: Negative for dysphoric mood.    Objective:  BP (!) 170/104   Pulse 80   Temp (!) 97.4 F (36.3 C)   Resp 16   Ht 5\' 10"  (1.778 m)   Wt 231 lb 12.8 oz (105.1 kg)   BMI 33.26 kg/m   BP/Weight 05/12/2020 9/50/9326 04/03/2457  Systolic BP 099 833 825  Diastolic BP 492 010 96  Wt. (Lbs) 231.8 - 231.2  BMI 33.26 - 33.17    Physical Exam Constitutional:      Appearance: Normal appearance.  Neck:     Vascular: No carotid bruit.  Cardiovascular:     Rate and Rhythm: Normal rate and regular rhythm.     Pulses: Normal pulses.     Heart sounds: Normal heart sounds.  Pulmonary:     Effort: Pulmonary effort is normal. No respiratory distress.     Breath sounds: Normal breath sounds.  Abdominal:     General: Bowel sounds are normal.     Palpations: Abdomen is soft.  Musculoskeletal:     Right lower leg: Edema (trace) present.     Left lower leg: Edema (trace) present.  Skin:    General:  Skin is dry.  Neurological:     Mental Status: He is alert and oriented to person, place, and time.  Psychiatric:        Mood and Affect: Mood normal.        Behavior: Behavior normal.     Lab Results  Component Value Date   WBC 6.2 12/27/2019   HGB 15.8 12/27/2019   HCT 45.7 12/27/2019   PLT 210 12/27/2019   GLUCOSE 95 01/10/2020   CHOL 214 (H) 12/27/2019   TRIG 111 12/27/2019   HDL 56 12/27/2019   LDLCALC 138 (H) 12/27/2019   ALT 19 01/10/2020   AST 27 01/10/2020   NA 144 01/10/2020   K 4.3 01/10/2020   CL 107 (H) 01/10/2020   CREATININE 1.16 01/10/2020   BUN 14 01/10/2020   CO2 24 01/10/2020      Assessment & Plan:  1. Essential hypertension, benign Start on valsartan 160/12.5 mg once daily. Continue to work on diet and exercise Check labwork - CBC, CMP  2. Hyperlipidemia -  Well controlled.  No changes to medicines.  Continue to work on eating a healthy diet and exercise.  Labs drawn today.  - LIPID  Follow-up: 4 weeks  An After Visit Summary was printed and given to the patient.  Rochel Brome [facility] [phone]

## 2020-05-13 LAB — CBC WITH DIFFERENTIAL/PLATELET
Basophils Absolute: 0.1 10*3/uL (ref 0.0–0.2)
Basos: 1 %
EOS (ABSOLUTE): 0.2 10*3/uL (ref 0.0–0.4)
Eos: 3 %
Hematocrit: 44.7 % (ref 37.5–51.0)
Hemoglobin: 15 g/dL (ref 13.0–17.7)
Immature Grans (Abs): 0 10*3/uL (ref 0.0–0.1)
Immature Granulocytes: 0 %
Lymphocytes Absolute: 1.5 10*3/uL (ref 0.7–3.1)
Lymphs: 20 %
MCH: 29.6 pg (ref 26.6–33.0)
MCHC: 33.6 g/dL (ref 31.5–35.7)
MCV: 88 fL (ref 79–97)
Monocytes Absolute: 0.5 10*3/uL (ref 0.1–0.9)
Monocytes: 7 %
Neutrophils Absolute: 5 10*3/uL (ref 1.4–7.0)
Neutrophils: 69 %
Platelets: 190 10*3/uL (ref 150–450)
RBC: 5.06 x10E6/uL (ref 4.14–5.80)
RDW: 13.4 % (ref 11.6–15.4)
WBC: 7.2 10*3/uL (ref 3.4–10.8)

## 2020-05-13 LAB — COMPREHENSIVE METABOLIC PANEL
ALT: 14 IU/L (ref 0–44)
AST: 20 IU/L (ref 0–40)
Albumin/Globulin Ratio: 2 (ref 1.2–2.2)
Albumin: 4.4 g/dL (ref 3.8–4.9)
Alkaline Phosphatase: 153 IU/L — ABNORMAL HIGH (ref 48–121)
BUN/Creatinine Ratio: 8 — ABNORMAL LOW (ref 9–20)
BUN: 10 mg/dL (ref 6–24)
Bilirubin Total: 0.6 mg/dL (ref 0.0–1.2)
CO2: 25 mmol/L (ref 20–29)
Calcium: 8.4 mg/dL — ABNORMAL LOW (ref 8.7–10.2)
Chloride: 105 mmol/L (ref 96–106)
Creatinine, Ser: 1.25 mg/dL (ref 0.76–1.27)
GFR calc Af Amer: 74 mL/min/{1.73_m2} (ref >59)
GFR calc non Af Amer: 64 mL/min/{1.73_m2} (ref >59)
Globulin, Total: 2.2 g/dL (ref 1.5–4.5)
Glucose: 95 mg/dL (ref 65–99)
Potassium: 3.6 mmol/L (ref 3.5–5.2)
Sodium: 143 mmol/L (ref 134–144)
Total Protein: 6.6 g/dL (ref 6.0–8.5)

## 2020-05-13 LAB — LIPID PANEL
Chol/HDL Ratio: 2.3 ratio (ref 0.0–5.0)
Cholesterol, Total: 120 mg/dL (ref 100–199)
HDL: 53 mg/dL (ref >39)
LDL Chol Calc (NIH): 55 mg/dL (ref 0–99)
Triglycerides: 49 mg/dL (ref 0–149)
VLDL Cholesterol Cal: 12 mg/dL (ref 5–40)

## 2020-05-13 LAB — CARDIOVASCULAR RISK ASSESSMENT

## 2020-05-23 ENCOUNTER — Other Ambulatory Visit: Payer: Self-pay | Admitting: Family Medicine

## 2020-05-23 MED ORDER — TESTOSTERONE CYPIONATE 200 MG/ML IM SOLN: INTRAMUSCULAR | 2 refills | Status: DC

## 2020-05-27 ENCOUNTER — Other Ambulatory Visit: Payer: Self-pay

## 2020-06-08 ENCOUNTER — Other Ambulatory Visit: Payer: Self-pay | Admitting: Family Medicine

## 2020-06-08 DIAGNOSIS — I10 Essential (primary) hypertension: Secondary | ICD-10-CM

## 2020-06-11 ENCOUNTER — Other Ambulatory Visit: Payer: Self-pay

## 2020-06-11 ENCOUNTER — Ambulatory Visit (INDEPENDENT_AMBULATORY_CARE_PROVIDER_SITE_OTHER): Payer: Federal, State, Local not specified - PPO | Admitting: Family Medicine

## 2020-06-11 ENCOUNTER — Encounter: Payer: Self-pay | Admitting: Family Medicine

## 2020-06-11 VITALS — BP 140/102 | HR 79 | Temp 97.6°F | Ht 70.0 in | Wt 232.0 lb

## 2020-06-11 DIAGNOSIS — E782 Mixed hyperlipidemia: Secondary | ICD-10-CM | POA: Diagnosis not present

## 2020-06-11 DIAGNOSIS — I10 Essential (primary) hypertension: Secondary | ICD-10-CM

## 2020-06-11 DIAGNOSIS — E298 Other testicular dysfunction: Secondary | ICD-10-CM | POA: Diagnosis not present

## 2020-06-11 MED ORDER — VALSARTAN-HYDROCHLOROTHIAZIDE 320-25 MG PO TABS: 1.0000 | ORAL_TABLET | Freq: Every day | ORAL | 2 refills | Status: DC

## 2020-06-11 MED ORDER — TESTOSTERONE CYPIONATE 200 MG/ML IM SOLN: INTRAMUSCULAR | 1 refills | Status: DC

## 2020-06-11 NOTE — Progress Notes (Signed)
Subjective:  Patient ID: Tyrone Wilson, male    DOB: [birth year]  Age: 54 y.o. MRN: [id number]  Chief Complaint  Patient presents with  . Hyperlipidemia  . Hypertension   HPI:  Patient presents in follow up of hypertension.  Currently taking Diovan-HCT 160/12.5 mg once daily. His bp is running 140s/90-100s.  For his high cholesterol he is taking rosuvastatin 20 mg once daily and tolerating it.. Eating low fat diet and exercising.   Past Medical History:  Diagnosis Date  . Hypertension   . Obesity    Past Surgical History:  Procedure Laterality Date  . LAPAROSCOPIC GASTRIC SLEEVE RESECTION    . TONSILECTOMY, ADENOIDECTOMY, BILATERAL MYRINGOTOMY AND TUBES      Family History  Problem Relation Age of Onset  . Hypertension Mother   . Diabetes Mother   . Hypertension Father   . Cancer Father   . Cancer Brother    Social History   Socioeconomic History  . Marital status: Married    Spouse name: Not on file  . Number of children: Not on file  . Years of education: Not on file  . Highest education level: Not on file  Occupational History  . Occupation: retired  Tobacco Use  . Smoking status: Never Smoker  . Smokeless tobacco: Never Used  Vaping Use  . Vaping Use: Never used  Substance and Sexual Activity  . Alcohol use: Yes    Comment: very little  . Drug use: Never  . Sexual activity: Not on file  Other Topics Concern  . Not on file  Social History Narrative  . Not on file   Social Determinants of Health   Financial Resource Strain:   . Difficulty of Paying Living Expenses: Not on file  Food Insecurity:   . Worried About Charity fundraiser in the Last Year: Not on file  . Ran Out of Food in the Last Year: Not on file  Transportation Needs:   . Lack of Transportation (Medical): Not on file  . Lack of Transportation (Non-Medical): Not on file  Physical Activity:   . Days of Exercise per Week: Not on file  . Minutes of Exercise per Session: Not on file    Stress:   . Feeling of Stress : Not on file  Social Connections:   . Frequency of Communication with Friends and Family: Not on file  . Frequency of Social Gatherings with Friends and Family: Not on file  . Attends Religious Services: Not on file  . Active Member of Clubs or Organizations: Not on file  . Attends Archivist Meetings: Not on file  . Marital Status: Not on file    Review of Systems  Constitutional: Negative for chills, fatigue and fever.  HENT: Negative for congestion, ear pain and sore throat.   Respiratory: Negative for cough and shortness of breath.   Cardiovascular: Negative for chest pain, palpitations and leg swelling.  Gastrointestinal: Negative for abdominal pain, constipation, diarrhea, nausea and vomiting.  Genitourinary: Negative for dysuria and urgency.  Musculoskeletal: Negative for arthralgias and myalgias.  Neurological: Negative for dizziness and headaches.  Psychiatric/Behavioral: Negative for dysphoric mood.    Objective:  BP (!) 140/102   Pulse 79   Temp 97.6 F (36.4 C)   Ht 5\' 10"  (1.778 m)   Wt 232 lb (105.2 kg)   SpO2 99%   BMI 33.29 kg/m   BP/Weight 06/11/2020 05/12/2020 4/81/8563  Systolic BP 149 702 637  Diastolic BP 858 850  110  Wt. (Lbs) 232 231.8 -  BMI 33.29 33.26 -    Physical Exam Constitutional:      Appearance: Normal appearance.  Neck:     Vascular: No carotid bruit.  Cardiovascular:     Rate and Rhythm: Normal rate and regular rhythm.     Pulses: Normal pulses.     Heart sounds: Normal heart sounds.  Pulmonary:     Effort: Pulmonary effort is normal. No respiratory distress.     Breath sounds: Normal breath sounds.  Musculoskeletal:     Right lower leg: No edema.     Left lower leg: No edema.  Neurological:     Mental Status: He is alert and oriented to person, place, and time.  Psychiatric:        Mood and Affect: Mood normal.        Behavior: Behavior normal.     Lab Results  Component Value  Date   WBC 7.2 05/12/2020   HGB 15.0 05/12/2020   HCT 44.7 05/12/2020   PLT 190 05/12/2020   GLUCOSE 95 05/12/2020   CHOL 120 05/12/2020   TRIG 49 05/12/2020   HDL 53 05/12/2020   LDLCALC 55 05/12/2020   ALT 14 05/12/2020   AST 20 05/12/2020   NA 143 05/12/2020   K 3.6 05/12/2020   CL 105 05/12/2020   CREATININE 1.25 05/12/2020   BUN 10 05/12/2020   CO2 25 05/12/2020      Assessment & Plan:  1. Essential hypertension, benign Increase valsartan/hctz 320/25 mg once daily. Continue to work on diet and exercise  2. Mixed hyperlipidemia The current medical regimen is effective;  continue present plan and medications.  3. Other testicular dysfunction The current medical regimen is effective;  continue present plan and medications.  Follow-up: 4 weeks  An After Visit Summary was printed and given to the patient.  [name] [phone]

## 2020-06-11 NOTE — Patient Instructions (Signed)
Increase valsartan/hctz 320/25 mg once daily.  If not bringing bp down, please let us know.

## 2020-08-11 ENCOUNTER — Other Ambulatory Visit: Payer: Self-pay | Admitting: Family Medicine

## 2020-09-10 NOTE — Progress Notes (Signed)
Subjective:  Patient ID: Tyrone Wilson, male    DOB: 1965/11/30  Age: 54 y.o. MRN: [id number]  Chief Complaint  Patient presents with  . Hyperlipidemia  . Hypertension  . Gastroesophageal Reflux    HPI GERD - Nexium Hyperlipidemia- Crestor tried. Was getting cramping in his legs which resolved upon resolution.   Hypertension-Valsartan-HCTZ 320/25 mg once daily.   Hypogonadism. Taking testosterone 250 mg once every 2 weeks Energy improve. Improved sex drive. Not having erectile dysfunciotn prior ot starting medicines.     Current Outpatient Medications on File Prior to Visit  Medication Sig Dispense Refill  . Cholecalciferol (VITAMIN D) 50 MCG (2000 UT) tablet Take 2,000 Units by mouth daily.    . Multiple Vitamin (MULTIVITAMIN) tablet Take 1 tablet by mouth daily.    . Potassium 99 MG TABS Take by mouth.    . esomeprazole (NEXIUM) 20 MG capsule Take 20 mg by mouth daily at 12 noon.    . Syringe/Needle, Disp, (SYRINGE 3CC/23GX1") 23G X 1" 3 ML MISC 1 each by Does not apply route every 14 (fourteen) days. 50 each 0  . testosterone cypionate (DEPOTESTOSTERONE CYPIONATE) 200 MG/ML injection ADMINISTER 1.25 ML(250 MG) IN THE MUSCLE 1 TIME FOR 1 DOSE 10 mL 1  . valsartan-hydrochlorothiazide (DIOVAN-HCT) 320-25 MG tablet TAKE 1 TABLET BY MOUTH DAILY 90 tablet 0   No current facility-administered medications on file prior to visit.   Past Medical History:  Diagnosis Date  . Hypertension   . Obesity    Past Surgical History:  Procedure Laterality Date  . LAPAROSCOPIC GASTRIC SLEEVE RESECTION    . TONSILECTOMY, ADENOIDECTOMY, BILATERAL MYRINGOTOMY AND TUBES      Family History  Problem Relation Age of Onset  . Hypertension Mother   . Diabetes Mother   . Hypertension Father   . Cancer Father   . Cancer Brother    Social History   Socioeconomic History  . Marital status: Married    Spouse name: Not on file  . Number of children: Not on file  . Years of education: Not on  file  . Highest education level: Not on file  Occupational History  . Occupation: retired  Tobacco Use  . Smoking status: Never Smoker  . Smokeless tobacco: Never Used  Vaping Use  . Vaping Use: Never used  Substance and Sexual Activity  . Alcohol use: Yes    Comment: very little  . Drug use: Never  . Sexual activity: Not on file  Other Topics Concern  . Not on file  Social History Narrative  . Not on file   Social Determinants of Health   Financial Resource Strain: Not on file  Food Insecurity: Not on file  Transportation Needs: Not on file  Physical Activity: Not on file  Stress: Not on file  Social Connections: Not on file    Review of Systems  Constitutional: Negative for chills, diaphoresis, fatigue and fever.  HENT: Negative for congestion, ear pain and sore throat.   Respiratory: Negative for cough and shortness of breath.   Cardiovascular: Negative for chest pain and leg swelling.  Gastrointestinal: Negative for abdominal pain, constipation, diarrhea, nausea and vomiting.  Genitourinary: Negative for dysuria and urgency.  Musculoskeletal: Negative for arthralgias and myalgias.  Neurological: Negative for dizziness and headaches.  Psychiatric/Behavioral: Negative for dysphoric mood.     Objective:  BP 134/88   Pulse 72   Temp 98.8 F (37.1 C)   Resp 16   Ht 5\' 10"  (1.778 m)  Wt 233 lb (105.7 kg)   BMI 33.43 kg/m   BP/Weight 09/11/2020 06/06/9243 03/05/8637  Systolic BP 177 116 579  Diastolic BP 88 038 333  Wt. (Lbs) 233 232 231.8  BMI 33.43 33.29 33.26    Physical Exam Vitals reviewed.  Constitutional:      Appearance: Normal appearance.  Neck:     Vascular: No carotid bruit.  Cardiovascular:     Rate and Rhythm: Normal rate and regular rhythm.     Pulses: Normal pulses.     Heart sounds: Normal heart sounds.  Pulmonary:     Effort: Pulmonary effort is normal.     Breath sounds: Normal breath sounds. No wheezing, rhonchi or rales.   Abdominal:     General: Bowel sounds are normal.     Palpations: Abdomen is soft.     Tenderness: There is no abdominal tenderness.  Neurological:     Mental Status: He is alert.  Psychiatric:        Mood and Affect: Mood normal.        Behavior: Behavior normal.     Diabetic Foot Exam - Simple   No data filed      Lab Results  Component Value Date   WBC 7.2 05/12/2020   HGB 15.0 05/12/2020   HCT 44.7 05/12/2020   PLT 190 05/12/2020   GLUCOSE 95 05/12/2020   CHOL 120 05/12/2020   TRIG 49 05/12/2020   HDL 53 05/12/2020   LDLCALC 55 05/12/2020   ALT 14 05/12/2020   AST 20 05/12/2020   NA 143 05/12/2020   K 3.6 05/12/2020   CL 105 05/12/2020   CREATININE 1.25 05/12/2020   BUN 10 05/12/2020   CO2 25 05/12/2020      Assessment & Plan:   1. Mixed hyperlipidemia On no medicines Continue to work on eating a healthy diet and exercise.  Labs drawn today.  - Lipid panel  2. Essential hypertension, benign Recommend continue to work on eating healthy diet and exercise. Well controlled. - CBC with Differential/Platelet - Comprehensive metabolic panel  3. Hypogonadism in male - Testosterone,Free and Total   4. Drug induced myopathy Consider alternative statin.  Refuses vaccinations.  Follow-up: Return in about 3 months (around 12/10/2020) for fasting.  An After Visit Summary was printed and given to the patient.  Rochel Brome, MD [facility] [phone]

## 2020-09-11 ENCOUNTER — Encounter: Payer: Self-pay | Admitting: Family Medicine

## 2020-09-11 ENCOUNTER — Ambulatory Visit (INDEPENDENT_AMBULATORY_CARE_PROVIDER_SITE_OTHER): Payer: Federal, State, Local not specified - PPO | Admitting: Family Medicine

## 2020-09-11 ENCOUNTER — Other Ambulatory Visit: Payer: Self-pay

## 2020-09-11 VITALS — BP 134/88 | HR 72 | Temp 98.8°F | Resp 16 | Ht 70.0 in | Wt 233.0 lb

## 2020-09-11 DIAGNOSIS — E291 Testicular hypofunction: Secondary | ICD-10-CM | POA: Diagnosis not present

## 2020-09-11 DIAGNOSIS — E782 Mixed hyperlipidemia: Secondary | ICD-10-CM

## 2020-09-11 DIAGNOSIS — I10 Essential (primary) hypertension: Secondary | ICD-10-CM | POA: Diagnosis not present

## 2020-09-11 DIAGNOSIS — G720 Drug-induced myopathy: Secondary | ICD-10-CM | POA: Diagnosis not present

## 2020-09-11 NOTE — Patient Instructions (Signed)
I strongly recommend the covid 19 vaccinations (pfizer or moderna.)  Please also continue to practice social distancing, mask wearing, and hand washing. Please call if you develop symptoms of COVID (fever, cough, shortness of breath, congestion, aches, loss of taset/smell, headaches) regardless of vaccination status.Marland Kitchen

## 2020-09-13 LAB — COMPREHENSIVE METABOLIC PANEL
ALT: 16 IU/L (ref 0–44)
AST: 22 IU/L (ref 0–40)
Albumin/Globulin Ratio: 1.8 (ref 1.2–2.2)
Albumin: 4.2 g/dL (ref 3.8–4.9)
Alkaline Phosphatase: 143 IU/L — ABNORMAL HIGH (ref 44–121)
BUN/Creatinine Ratio: 8 — ABNORMAL LOW (ref 9–20)
BUN: 11 mg/dL (ref 6–24)
Bilirubin Total: 0.6 mg/dL (ref 0.0–1.2)
CO2: 26 mmol/L (ref 20–29)
Calcium: 8.6 mg/dL — ABNORMAL LOW (ref 8.7–10.2)
Chloride: 99 mmol/L (ref 96–106)
Creatinine, Ser: 1.41 mg/dL — ABNORMAL HIGH (ref 0.76–1.27)
GFR calc Af Amer: 64 mL/min/{1.73_m2} (ref >59)
GFR calc non Af Amer: 56 mL/min/{1.73_m2} — ABNORMAL LOW (ref >59)
Globulin, Total: 2.4 g/dL (ref 1.5–4.5)
Glucose: 88 mg/dL (ref 65–99)
Potassium: 4.5 mmol/L (ref 3.5–5.2)
Sodium: 139 mmol/L (ref 134–144)
Total Protein: 6.6 g/dL (ref 6.0–8.5)

## 2020-09-13 LAB — CBC WITH DIFFERENTIAL/PLATELET
Basophils Absolute: 0 10*3/uL (ref 0.0–0.2)
Basos: 1 %
EOS (ABSOLUTE): 0.1 10*3/uL (ref 0.0–0.4)
Eos: 2 %
Hematocrit: 47 % (ref 37.5–51.0)
Hemoglobin: 15.7 g/dL (ref 13.0–17.7)
Immature Grans (Abs): 0 10*3/uL (ref 0.0–0.1)
Immature Granulocytes: 0 %
Lymphocytes Absolute: 1.4 10*3/uL (ref 0.7–3.1)
Lymphs: 21 %
MCH: 29.3 pg (ref 26.6–33.0)
MCHC: 33.4 g/dL (ref 31.5–35.7)
MCV: 88 fL (ref 79–97)
Monocytes Absolute: 0.5 10*3/uL (ref 0.1–0.9)
Monocytes: 8 %
Neutrophils Absolute: 4.5 10*3/uL (ref 1.4–7.0)
Neutrophils: 68 %
Platelets: 225 10*3/uL (ref 150–450)
RBC: 5.36 x10E6/uL (ref 4.14–5.80)
RDW: 14.1 % (ref 11.6–15.4)
WBC: 6.5 10*3/uL (ref 3.4–10.8)

## 2020-09-13 LAB — LIPID PANEL
Chol/HDL Ratio: 4 ratio (ref 0.0–5.0)
Cholesterol, Total: 176 mg/dL (ref 100–199)
HDL: 44 mg/dL (ref >39)
LDL Chol Calc (NIH): 119 mg/dL — ABNORMAL HIGH (ref 0–99)
Triglycerides: 67 mg/dL (ref 0–149)
VLDL Cholesterol Cal: 13 mg/dL (ref 5–40)

## 2020-09-13 LAB — CARDIOVASCULAR RISK ASSESSMENT

## 2020-09-13 LAB — TESTOSTERONE,FREE AND TOTAL
Testosterone, Free: 37.7 pg/mL — ABNORMAL HIGH (ref 7.2–24.0)
Testosterone: 1266 ng/dL — ABNORMAL HIGH (ref 264–916)

## 2020-09-16 ENCOUNTER — Other Ambulatory Visit: Payer: Self-pay

## 2020-09-16 DIAGNOSIS — R799 Abnormal finding of blood chemistry, unspecified: Secondary | ICD-10-CM

## 2020-09-17 ENCOUNTER — Encounter: Payer: Self-pay | Admitting: Family Medicine

## 2020-09-29 ENCOUNTER — Other Ambulatory Visit: Payer: Self-pay | Admitting: Legal Medicine

## 2020-09-29 DIAGNOSIS — E782 Mixed hyperlipidemia: Secondary | ICD-10-CM

## 2020-10-09 ENCOUNTER — Other Ambulatory Visit: Payer: Federal, State, Local not specified - PPO

## 2020-10-09 ENCOUNTER — Other Ambulatory Visit: Payer: Self-pay

## 2020-10-09 DIAGNOSIS — R799 Abnormal finding of blood chemistry, unspecified: Secondary | ICD-10-CM | POA: Diagnosis not present

## 2020-10-10 LAB — COMPREHENSIVE METABOLIC PANEL
ALT: 10 IU/L (ref 0–44)
AST: 16 IU/L (ref 0–40)
Albumin/Globulin Ratio: 1.5 (ref 1.2–2.2)
Albumin: 3.9 g/dL (ref 3.8–4.9)
Alkaline Phosphatase: 140 IU/L — ABNORMAL HIGH (ref 44–121)
BUN/Creatinine Ratio: 11 (ref 9–20)
BUN: 15 mg/dL (ref 6–24)
Bilirubin Total: 0.4 mg/dL (ref 0.0–1.2)
CO2: 28 mmol/L (ref 20–29)
Calcium: 8.5 mg/dL — ABNORMAL LOW (ref 8.7–10.2)
Chloride: 101 mmol/L (ref 96–106)
Creatinine, Ser: 1.31 mg/dL — ABNORMAL HIGH (ref 0.76–1.27)
GFR calc Af Amer: 70 mL/min/{1.73_m2} (ref >59)
GFR calc non Af Amer: 61 mL/min/{1.73_m2} (ref >59)
Globulin, Total: 2.6 g/dL (ref 1.5–4.5)
Glucose: 91 mg/dL (ref 65–99)
Potassium: 4.2 mmol/L (ref 3.5–5.2)
Sodium: 141 mmol/L (ref 134–144)
Total Protein: 6.5 g/dL (ref 6.0–8.5)

## 2020-10-13 ENCOUNTER — Encounter: Payer: Self-pay | Admitting: Family Medicine

## 2020-10-17 NOTE — Telephone Encounter (Signed)
Please call pt and explain that his kidney function improved slightly.  This was my concern. Recommend continue to hydrate. Kc

## 2020-11-07 ENCOUNTER — Other Ambulatory Visit: Payer: Self-pay | Admitting: Physician Assistant

## 2020-12-12 ENCOUNTER — Ambulatory Visit: Payer: Federal, State, Local not specified - PPO | Admitting: Family Medicine

## 2021-02-05 ENCOUNTER — Other Ambulatory Visit: Payer: Self-pay | Admitting: Family Medicine

## 2021-02-10 ENCOUNTER — Telehealth: Payer: Self-pay

## 2021-02-10 NOTE — Telephone Encounter (Signed)
PA submitted and denied for testosterone cypionate via covermymeds.  Per insurance "The indicated use of this medication, as provided does not meet the [employer] Plan's criteria due to the following reason: continuation of treatment with a total testosterone level greater than 800 ng per deciliter does not establish medical necessity for this drug. Medical necessity is determined by adherence to generally accepted standards of medical practice in the [place], is clinically appropriate, in terms of type, frequency, extent, site, duration and considered effective for the patient's illness, injury, disease, or its symptoms."

## 2021-03-18 ENCOUNTER — Other Ambulatory Visit: Payer: Self-pay

## 2021-03-18 ENCOUNTER — Ambulatory Visit: Payer: Federal, State, Local not specified - PPO | Admitting: Legal Medicine

## 2021-03-18 ENCOUNTER — Encounter: Payer: Self-pay | Admitting: Legal Medicine

## 2021-03-18 DIAGNOSIS — M7710 Lateral epicondylitis, unspecified elbow: Secondary | ICD-10-CM | POA: Insufficient documentation

## 2021-03-18 DIAGNOSIS — M7712 Lateral epicondylitis, left elbow: Secondary | ICD-10-CM

## 2021-03-18 DIAGNOSIS — Z6833 Body mass index (BMI) 33.0-33.9, adult: Secondary | ICD-10-CM

## 2021-03-18 NOTE — Patient Instructions (Signed)
Tennis Elbow  Tennis elbow is irritation and swelling (inflammation) in your outer forearm, near your elbow. Swelling affects the tissues that connect muscle to bone (tendons). Tennis elbow can happen playing any sport or doing any job where you useyour elbow too much. It is caused by doing the same motion over and over. What are the causes? This condition is often caused by playing sports or doing work where you need to keep moving your forearm the same way. Sometimes, it may be caused by asudden injury. What increases the risk? You are more likely to get tennis elbow if you play tennis or another racket sport. You also have a higher risk if you often use your hands for work. This includes: People who use computers. Construction workers. People who work in a factory. Musicians. Cooks. Cashiers. What are the signs or symptoms? Pain and tenderness in your forearm and the outer part of your elbow. You may have pain all the time or only when you use your arm. A burning feeling. This starts in your elbow and spreads down your arm. A weak grip in your hand. How is this treated? Resting and icing your arm is often the first treatment. Your doctor may also recommend: Medicines to reduce pain and swelling. An elbow strap. Physical therapy. This may include massage or exercises or both. An elbow brace. If these do not help your symptoms get better, your doctor may recommendsurgery. Follow these instructions at home: If you have a brace or strap: Wear the brace or strap as told by your doctor. Take it off only as told by your doctor. Check the skin around the brace or strap every day. Tell your doctor if you see problems. Loosen it if your fingers: Tingle. Become numb. Turn cold and blue. Keep the brace or strap clean. If the brace or strap is not waterproof: Do not let it get wet. Cover it with a watertight covering when you take a bath or a shower. Managing pain, stiffness, and  swelling  If told, put ice on the injured area. To do this: If you have a removable brace or strap, take it off as told by your doctor. Put ice in a plastic bag. Place a towel between your skin and the bag. Leave the ice on for 20 minutes, 2-3 times a day. Take off the ice if your skin turns bright red. This is very important. If you cannot feel pain, heat, or cold, you have a greater risk of damage to the area. Move your fingers often.  Activity Rest your elbow and wrist. Avoid activities that can cause elbow problems as told by your doctor. Do exercises as told by your doctor. If you lift an object, lift it with your palm facing up. Lifestyle If your tennis elbow is caused by sports, check your equipment and make sure that: You are using it the right way. It fits you well. If your tennis elbow is caused by work or computer use, take breaks often to stretch your arm. Talk with your manager about how you can make your condition better at work. General instructions Take over-the-counter and prescription medicines only as told by your doctor. Do not smoke or use any products that contain nicotine or tobacco. If you need help quitting, ask your doctor. Keep all follow-up visits. How is this prevented? Before and after being active: Warm up and stretch before being active. Cool down and stretch after being active. Give your body time to rest between   activities. While being active: Make sure to use equipment that fits you. If you play tennis, put power in your stroke with your lower body. Avoid using your arm only. Maintain physical fitness. This includes: Strength. Flexibility. Endurance. Do exercises to strengthen the forearm muscles. Contact a doctor if: Your pain does not get better with treatment. Your pain gets worse. You have weakness in your forearm, hand, or fingers. You cannot feel your forearm, hand, or fingers. Get help right away if: Your pain is very bad. You  cannot move your wrist. Summary Tennis elbow is irritation and swelling (inflammation) in your outer forearm, near your elbow. Tennis elbow is caused by doing the same motion over and over. Rest your elbow and wrist. Avoid activities as told by your doctor. If told, put ice on the injured area for 20 minutes, 2-3 times a day. This information is not intended to replace advice given to you by your health care provider. Make sure you discuss any questions you have with your healthcare provider. Document Revised: 04/01/2020 Document Reviewed: 04/01/2020 Elsevier Patient Education  2022 [employer]  

## 2021-03-18 NOTE — Progress Notes (Signed)
Established Patient Office Visit  Subjective:  Patient ID: Tyrone Wilson, male    DOB: Sep 20, 1966  Age: 55 y.o. MRN: [id number]  CC:  Chief Complaint  Patient presents with   Forearm pain    Patient try to pick up something heavy 2 weeks ago and he felt sharp pain on left forearm to elbow.    HPI Tyrone Wilson presents for forearm pain. Lifting sign sudden onset pain 1.5 weeks , sharp pain left wrist ,it radiates to shoulder at times.  Worse when he pick up things.  Past Medical History:  Diagnosis Date   Hypertension    Obesity     Past Surgical History:  Procedure Laterality Date   LAPAROSCOPIC GASTRIC SLEEVE RESECTION     TONSILECTOMY, ADENOIDECTOMY, BILATERAL MYRINGOTOMY AND TUBES      Family History  Problem Relation Age of Onset   Hypertension Mother    Diabetes Mother    Hypertension Father    Cancer Father    Cancer Brother     Social History   Socioeconomic History   Marital status: Married    Spouse name: Not on file   Number of children: Not on file   Years of education: Not on file   Highest education level: Not on file  Occupational History   Occupation: retired  Tobacco Use   Smoking status: Never   Smokeless tobacco: Never  Vaping Use   Vaping Use: Never used  Substance and Sexual Activity   Alcohol use: Not Currently   Drug use: Never   Sexual activity: Yes    Partners: Female  Other Topics Concern   Not on file  Social History Narrative   Not on file   Social Determinants of Health   Financial Resource Strain: Not on file  Food Insecurity: Not on file  Transportation Needs: Not on file  Physical Activity: Not on file  Stress: Not on file  Social Connections: Not on file  Intimate Partner Violence: Not on file    Outpatient Medications Prior to Visit  Medication Sig Dispense Refill   Cholecalciferol (VITAMIN D) 50 MCG (2000 UT) tablet Take 2,000 Units by mouth daily.     esomeprazole (NEXIUM) 20 MG capsule Take 20 mg by  mouth daily at 12 noon.     Multiple Vitamin (MULTIVITAMIN) tablet Take 1 tablet by mouth daily.     Potassium 99 MG TABS Take by mouth.     Syringe/Needle, Disp, (SYRINGE 3CC/23GX1") 23G X 1" 3 ML MISC 1 each by Does not apply route every 14 (fourteen) days. 50 each 0   testosterone cypionate (DEPOTESTOSTERONE CYPIONATE) 200 MG/ML injection INJECT 1.25 ML(250 MG) IN THE MUSCLE EVERY 2 WEEKS 10 mL 0   valsartan-hydrochlorothiazide (DIOVAN-HCT) 320-25 MG tablet TAKE 1 TABLET BY MOUTH DAILY 90 tablet 0   No facility-administered medications prior to visit.    No Known Allergies  ROS Review of Systems  Constitutional:  Negative for chills, fatigue and fever.  HENT:  Negative for congestion, ear pain and sore throat.   Respiratory:  Negative for cough and shortness of breath.   Cardiovascular:  Negative for chest pain.  Gastrointestinal:  Negative for abdominal pain, constipation, diarrhea, nausea and vomiting.  Endocrine: Negative for polydipsia, polyphagia and polyuria.  Genitourinary:  Negative for dysuria and frequency.  Musculoskeletal:  Negative for arthralgias and myalgias.  Neurological:  Negative for dizziness and headaches.  Psychiatric/Behavioral:  Negative for dysphoric mood.        No dysphoria  Objective:    Physical Exam Vitals reviewed.  Constitutional:      Appearance: Normal appearance. He is obese.  Cardiovascular:     Rate and Rhythm: Normal rate and regular rhythm.     Pulses: Normal pulses.     Heart sounds: Normal heart sounds.    No gallop.  Pulmonary:     Effort: Pulmonary effort is normal.     Breath sounds: Normal breath sounds.  Musculoskeletal:     Right elbow: Normal.     Left elbow: Decreased range of motion. Tenderness present.     Cervical back: Normal range of motion and neck supple.     Comments: Pain on supination against resistance  Neurological:     Mental Status: He is alert.    BP 132/80   Pulse 66   Temp (!) 97.4 F (36.3 C)    Resp 16   Ht 5\' 10"  (1.778 m)   Wt 235 lb (106.6 kg)   SpO2 93%   BMI 33.72 kg/m  Wt Readings from Last 3 Encounters:  03/18/21 235 lb (106.6 kg)  09/11/20 233 lb (105.7 kg)  06/11/20 232 lb (105.2 kg)     Health Maintenance Due  Topic Date Due   HIV Screening  Never done   Hepatitis C Screening  Never done   Zoster Vaccines- Shingrix (1 of 2) Never done    There are no preventive care reminders to display for this patient.  No results found for: TSH Lab Results  Component Value Date   WBC 6.5 09/11/2020   HGB 15.7 09/11/2020   HCT 47.0 09/11/2020   MCV 88 09/11/2020   PLT 225 09/11/2020   Lab Results  Component Value Date   NA 141 10/09/2020   K 4.2 10/09/2020   CO2 28 10/09/2020   GLUCOSE 91 10/09/2020   BUN 15 10/09/2020   CREATININE 1.31 (H) 10/09/2020   BILITOT 0.4 10/09/2020   ALKPHOS 140 (H) 10/09/2020   AST 16 10/09/2020   ALT 10 10/09/2020   PROT 6.5 10/09/2020   ALBUMIN 3.9 10/09/2020   CALCIUM 8.5 (L) 10/09/2020   Lab Results  Component Value Date   CHOL 176 09/11/2020   Lab Results  Component Value Date   HDL 44 09/11/2020   Lab Results  Component Value Date   LDLCALC 119 (H) 09/11/2020   Lab Results  Component Value Date   TRIG 67 09/11/2020   Lab Results  Component Value Date   CHOLHDL 4.0 09/11/2020   No results found for: HGBA1C    Assessment & Plan:   Diagnoses and all orders for this visit: BMI 33.0-33.9,adult An individualize plan was formulated for obesity using patient history and physical exam to encourage weight loss.  An evidence based program was formulated.  Patient is to cut portion size with meals and to plan physical exercise 3 days a week at least 20 minutes.  Weight watchers and other programs are helpful.  Planned amount of weight loss 10 lbs.   Lateral epicondylitis of left elbow  Patient has lateral epicondylitis left elbow, injected and home exercises given  Procedure:the left lateral epicondyle was  prepped after informed consent.  1cc of 1% xylocaine and one cc kenalog injected under common tendon laterally.  No blood loss,no complications   Follow-up: Return if symptoms worsen or fail to improve.    Reinaldo Meeker, MD

## 2021-03-30 ENCOUNTER — Telehealth: Payer: Self-pay

## 2021-03-30 ENCOUNTER — Other Ambulatory Visit: Payer: Self-pay

## 2021-03-30 DIAGNOSIS — M7712 Lateral epicondylitis, left elbow: Secondary | ICD-10-CM

## 2021-03-30 DIAGNOSIS — M25522 Pain in left elbow: Secondary | ICD-10-CM

## 2021-03-30 NOTE — Telephone Encounter (Signed)
Set up mri elbow lp

## 2021-03-30 NOTE — Telephone Encounter (Signed)
Made pt aware. He is awaiting appointment.   Tyrone Wilson, [place] 03/30/21 4:40 PM

## 2021-03-30 NOTE — Telephone Encounter (Signed)
Pt calling asking if imaging can be ordered for his elbow. States elbow is not better and it is effecting his ADLs. He does not want to wait weeks for imaging.   Royce Macadamia, [place] 03/30/21 4:08 PM

## 2021-03-31 ENCOUNTER — Other Ambulatory Visit: Payer: Self-pay | Admitting: Legal Medicine

## 2021-03-31 DIAGNOSIS — M7712 Lateral epicondylitis, left elbow: Secondary | ICD-10-CM

## 2021-03-31 DIAGNOSIS — M25522 Pain in left elbow: Secondary | ICD-10-CM

## 2021-04-13 ENCOUNTER — Other Ambulatory Visit: Payer: Federal, State, Local not specified - PPO

## 2021-04-15 ENCOUNTER — Ambulatory Visit
Admission: RE | Admit: 2021-04-15 | Discharge: 2021-04-15 | Disposition: A | Discharge status: Home / Self Care | Payer: Federal, State, Local not specified - PPO | Source: Ambulatory Visit | Attending: Legal Medicine | Admitting: Legal Medicine

## 2021-04-15 ENCOUNTER — Other Ambulatory Visit: Payer: Self-pay

## 2021-04-15 DIAGNOSIS — S46212A Strain of muscle, fascia and tendon of other parts of biceps, left arm, initial encounter: Secondary | ICD-10-CM | POA: Diagnosis not present

## 2021-04-15 DIAGNOSIS — M7712 Lateral epicondylitis, left elbow: Secondary | ICD-10-CM

## 2021-04-15 DIAGNOSIS — M25522 Pain in left elbow: Secondary | ICD-10-CM

## 2021-04-15 MED ORDER — GADOBENATE DIMEGLUMINE 529 MG/ML IV SOLN
20.0000 mL | Freq: Once | INTRAVENOUS | Status: AC | PRN
  Administered 2021-04-15: 20 mL via INTRAVENOUS

## 2021-04-16 NOTE — Progress Notes (Signed)
High grade bicipital tear, needs ortho referral for 50% tear lp

## 2021-05-04 ENCOUNTER — Other Ambulatory Visit: Payer: Self-pay | Admitting: Family Medicine

## 2021-05-06 ENCOUNTER — Other Ambulatory Visit: Payer: Self-pay

## 2021-05-06 ENCOUNTER — Telehealth: Payer: Self-pay | Admitting: Legal Medicine

## 2021-05-06 DIAGNOSIS — M7712 Lateral epicondylitis, left elbow: Secondary | ICD-10-CM

## 2021-05-06 NOTE — Telephone Encounter (Signed)
Pt needs referral to [employer]

## 2021-05-06 NOTE — Telephone Encounter (Signed)
Refer to orthopedics for lateral epicondylitis lp

## 2021-05-06 NOTE — Telephone Encounter (Signed)
I put the order in for referral for Orthopedic.

## 2021-05-07 DIAGNOSIS — S46212A Strain of muscle, fascia and tendon of other parts of biceps, left arm, initial encounter: Secondary | ICD-10-CM | POA: Diagnosis not present

## 2021-07-20 ENCOUNTER — Encounter: Payer: Self-pay | Admitting: Family Medicine

## 2021-07-20 ENCOUNTER — Other Ambulatory Visit: Payer: Self-pay

## 2021-07-20 ENCOUNTER — Ambulatory Visit (INDEPENDENT_AMBULATORY_CARE_PROVIDER_SITE_OTHER): Payer: Federal, State, Local not specified - PPO | Admitting: Family Medicine

## 2021-07-20 VITALS — BP 112/76 | HR 80 | Temp 97.2°F | Resp 16 | Ht 70.0 in | Wt 221.0 lb

## 2021-07-20 DIAGNOSIS — R002 Palpitations: Secondary | ICD-10-CM

## 2021-07-20 DIAGNOSIS — S46212D Strain of muscle, fascia and tendon of other parts of biceps, left arm, subsequent encounter: Secondary | ICD-10-CM | POA: Insufficient documentation

## 2021-07-20 DIAGNOSIS — T5891XA Toxic effect of carbon monoxide from unspecified source, accidental (unintentional), initial encounter: Secondary | ICD-10-CM | POA: Diagnosis not present

## 2021-07-20 DIAGNOSIS — E6609 Other obesity due to excess calories: Secondary | ICD-10-CM

## 2021-07-20 DIAGNOSIS — E291 Testicular hypofunction: Secondary | ICD-10-CM | POA: Diagnosis not present

## 2021-07-20 DIAGNOSIS — Z0000 Encounter for general adult medical examination without abnormal findings: Secondary | ICD-10-CM | POA: Diagnosis not present

## 2021-07-20 DIAGNOSIS — Z6831 Body mass index (BMI) 31.0-31.9, adult: Secondary | ICD-10-CM

## 2021-07-20 MED ORDER — "SYRINGE 23G X 1"" 3 ML MISC": 1.0000 | 0 refills | Status: AC
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
## 2021-07-20 NOTE — Patient Instructions (Signed)

## 2021-07-21 ENCOUNTER — Other Ambulatory Visit: Payer: Self-pay | Admitting: Family Medicine

## 2021-07-21 DIAGNOSIS — M9905 Segmental and somatic dysfunction of pelvic region: Secondary | ICD-10-CM | POA: Diagnosis not present

## 2021-07-21 DIAGNOSIS — M9903 Segmental and somatic dysfunction of lumbar region: Secondary | ICD-10-CM | POA: Diagnosis not present

## 2021-07-21 DIAGNOSIS — M5387 Other specified dorsopathies, lumbosacral region: Secondary | ICD-10-CM | POA: Diagnosis not present

## 2021-07-21 DIAGNOSIS — M461 Sacroiliitis, not elsewhere classified: Secondary | ICD-10-CM | POA: Diagnosis not present

## 2021-07-21 LAB — COMPREHENSIVE METABOLIC PANEL
ALT: 10 IU/L (ref 0–44)
AST: 14 IU/L (ref 0–40)
Albumin/Globulin Ratio: 1.6 (ref 1.2–2.2)
Albumin: 3.9 g/dL (ref 3.8–4.9)
Alkaline Phosphatase: 122 IU/L — ABNORMAL HIGH (ref 44–121)
BUN/Creatinine Ratio: 13 (ref 9–20)
BUN: 17 mg/dL (ref 6–24)
Bilirubin Total: 0.4 mg/dL (ref 0.0–1.2)
CO2: 25 mmol/L (ref 20–29)
Calcium: 8.7 mg/dL (ref 8.7–10.2)
Chloride: 102 mmol/L (ref 96–106)
Creatinine, Ser: 1.35 mg/dL — ABNORMAL HIGH (ref 0.76–1.27)
Globulin, Total: 2.4 g/dL (ref 1.5–4.5)
Glucose: 84 mg/dL (ref 70–99)
Potassium: 4 mmol/L (ref 3.5–5.2)
Sodium: 142 mmol/L (ref 134–144)
Total Protein: 6.3 g/dL (ref 6.0–8.5)
eGFR: 62 mL/min/{1.73_m2} (ref >59)

## 2021-07-21 LAB — CBC WITH DIFFERENTIAL/PLATELET
Basophils Absolute: 0 10*3/uL (ref 0.0–0.2)
Basos: 1 %
EOS (ABSOLUTE): 0.1 10*3/uL (ref 0.0–0.4)
Eos: 2 %
Hematocrit: 39.1 % (ref 37.5–51.0)
Hemoglobin: 12.7 g/dL — ABNORMAL LOW (ref 13.0–17.7)
Immature Grans (Abs): 0 10*3/uL (ref 0.0–0.1)
Immature Granulocytes: 0 %
Lymphocytes Absolute: 1.7 10*3/uL (ref 0.7–3.1)
Lymphs: 24 %
MCH: 28 pg (ref 26.6–33.0)
MCHC: 32.5 g/dL (ref 31.5–35.7)
MCV: 86 fL (ref 79–97)
Monocytes Absolute: 0.6 10*3/uL (ref 0.1–0.9)
Monocytes: 9 %
Neutrophils Absolute: 4.6 10*3/uL (ref 1.4–7.0)
Neutrophils: 64 %
Platelets: 317 10*3/uL (ref 150–450)
RBC: 4.53 x10E6/uL (ref 4.14–5.80)
RDW: 13.3 % (ref 11.6–15.4)
WBC: 7.1 10*3/uL (ref 3.4–10.8)

## 2021-07-21 LAB — LIPID PANEL
Chol/HDL Ratio: 4.3 ratio (ref 0.0–5.0)
Cholesterol, Total: 158 mg/dL (ref 100–199)
HDL: 37 mg/dL — ABNORMAL LOW (ref >39)
LDL Chol Calc (NIH): 108 mg/dL — ABNORMAL HIGH (ref 0–99)
Triglycerides: 67 mg/dL (ref 0–149)
VLDL Cholesterol Cal: 13 mg/dL (ref 5–40)

## 2021-07-21 LAB — TESTOSTERONE,FREE AND TOTAL
Testosterone, Free: 11.9 pg/mL (ref 7.2–24.0)
Testosterone: 371 ng/dL (ref 264–916)

## 2021-07-21 LAB — TSH: TSH: 3.44 u[IU]/mL (ref 0.450–4.500)

## 2021-07-21 LAB — CARBON MONOXIDE, BLOOD (PERFORMED AT REF LAB): CARBON MONOXIDE, BLOOD: 2.5 % (ref 0.0–3.6)

## 2021-07-22 NOTE — Progress Notes (Signed)
Blood count abnormal. Hb very slightly low.  Liver function normal.  Kidney function abnormal. Stable.  Thyroid function normal.  Cholesterol: LDL improved from 119 to 108. Recommend low fat diet and exercise.  Testosterone: low normal. Recommend increase back to 1.25 ml every 2 weeks.

## 2021-07-24 ENCOUNTER — Telehealth: Payer: Self-pay

## 2021-07-24 NOTE — Telephone Encounter (Signed)
Prior auth approved for Testosterone injections on 07/23/2021.

## 2021-08-01 ENCOUNTER — Other Ambulatory Visit: Payer: Self-pay | Admitting: Family Medicine

## 2021-08-02 DIAGNOSIS — E291 Testicular hypofunction: Secondary | ICD-10-CM | POA: Insufficient documentation

## 2021-08-02 NOTE — Assessment & Plan Note (Signed)
Check testosterone levels.  

## 2021-08-02 NOTE — Assessment & Plan Note (Signed)

## 2021-08-02 NOTE — Assessment & Plan Note (Signed)
Recommend continue to work on eating healthy diet and start exercising.

## 2021-08-02 NOTE — Assessment & Plan Note (Signed)
Check carbon monoxide level which came back normal.

## 2021-10-20 ENCOUNTER — Ambulatory Visit: Payer: Federal, State, Local not specified - PPO | Admitting: Legal Medicine

## 2021-10-20 ENCOUNTER — Other Ambulatory Visit: Payer: Self-pay

## 2021-10-20 ENCOUNTER — Encounter: Payer: Self-pay | Admitting: Legal Medicine

## 2021-10-20 VITALS — BP 110/78 | HR 68 | Temp 99.0°F | Resp 16 | Ht 70.0 in | Wt 221.0 lb

## 2021-10-20 DIAGNOSIS — R1011 Right upper quadrant pain: Secondary | ICD-10-CM

## 2021-10-20 NOTE — Progress Notes (Signed)
Acute Office Visit  Subjective:    Patient ID: Tyrone Wilson, male    DOB: 1965/10/29, 56 y.o.   MRN: [id number]  Chief Complaint  Patient presents with   Abdominal Pain    HPI: Patient is in today for RUQ and RLQ with radiation to the back pain since one week ago. Patient mentioned is a constant sharp and throbbing pain. He tried aleve, heating pad, miralax, dulcolax. He mentioned had a gastro sleeve took of 85% 14 years ago. He denied any injury on the back. He was concerned that it was constipation. When he eats after he did not eat in a while it bothered him more. He denied any blood or changes of color on the stool.  Past Medical History:  Diagnosis Date   Hypertension    Obesity     Past Surgical History:  Procedure Laterality Date   LAPAROSCOPIC GASTRIC SLEEVE RESECTION     TONSILECTOMY, ADENOIDECTOMY, BILATERAL MYRINGOTOMY AND TUBES      Family History  Problem Relation Age of Onset   Hypertension Mother    Diabetes Mother    Hypertension Father    Cancer Father    Cancer Brother     Social History   Socioeconomic History   Marital status: Married    Spouse name: Not on file   Number of children: Not on file   Years of education: Not on file   Highest education level: Not on file  Occupational History   Occupation: retired  Tobacco Use   Smoking status: Never   Smokeless tobacco: Never  Vaping Use   Vaping Use: Never used  Substance and Sexual Activity   Alcohol use: Not Currently   Drug use: Never   Sexual activity: Yes    Partners: Female  Other Topics Concern   Not on file  Social History Narrative   Not on file   Social Determinants of Health   Financial Resource Strain: Low Risk    Difficulty of Paying Living Expenses: Not hard at all  Food Insecurity: No Food Insecurity   Worried About Charity fundraiser in the Last Year: Never true   [place] in the Last Year: Never true  Transportation Needs: No Transportation Needs   Lack  of Transportation (Medical): No   Lack of Transportation (Non-Medical): No  Physical Activity: Inactive   Days of Exercise per Week: 0 days   Minutes of Exercise per Session: 0 min  Stress: No Stress Concern Present   Feeling of Stress : Not at all  Social Connections: Unknown   Frequency of Communication with Friends and Family: Not on file   Frequency of Social Gatherings with Friends and Family: Not on file   Attends Religious Services: Not on file   Active Member of Clubs or Organizations: Not on file   Attends Archivist Meetings: Not on file   Marital Status: Married  Human resources officer Violence: Not At Risk   Fear of Current or Ex-Partner: No   Emotionally Abused: No   Physically Abused: No   Sexually Abused: No    Outpatient Medications Prior to Visit  Medication Sig Dispense Refill   BD DISP NEEDLE 23G X 1" MISC USE 1 NEEDLE BY DOSE EVERY 14 DAYS     Cholecalciferol (VITAMIN D) 50 MCG (2000 UT) tablet Take 2,000 Units by mouth daily.     esomeprazole (NEXIUM) 20 MG capsule Take 20 mg by mouth daily at 12 noon.  Multiple Vitamin (MULTIVITAMIN) tablet Take 1 tablet by mouth daily.     Potassium 99 MG TABS Take by mouth.     Syringe/Needle, Disp, (SYRINGE 3CC/23GX1") 23G X 1" 3 ML MISC 1 each by Does not apply route every 14 (fourteen) days. 50 each 0   testosterone cypionate (DEPOTESTOSTERONE CYPIONATE) 200 MG/ML injection INJECT 1.25 ML INTO THE MUSCLE EVERY 2 WEEKS 10 mL 0   valsartan-hydrochlorothiazide (DIOVAN-HCT) 320-25 MG tablet TAKE 1 TABLET BY MOUTH DAILY 90 tablet 0   No facility-administered medications prior to visit.    No Known Allergies  Review of Systems  Constitutional:  Negative for chills, fatigue, fever and unexpected weight change.  HENT:  Negative for congestion, ear pain, sinus pain and sore throat.   Respiratory:  Negative for cough and shortness of breath.   Cardiovascular:  Negative for chest pain and palpitations.   Gastrointestinal:  Positive for abdominal pain (RUQ and RLQ pain). Negative for abdominal distention, blood in stool, constipation, diarrhea, nausea and vomiting.  Endocrine: Negative for polydipsia.  Genitourinary:  Negative for dysuria.  Musculoskeletal:  Negative for back pain.  Skin:  Negative for rash.  Neurological:  Negative for headaches.      Objective:    Physical Exam Constitutional:      Appearance: Normal appearance. He is normal weight.  HENT:     Head: Normocephalic.     Mouth/Throat:     Pharynx: No posterior oropharyngeal erythema.  Neck:     Vascular: No carotid bruit.  Cardiovascular:     Rate and Rhythm: Normal rate and regular rhythm.     Pulses: Normal pulses.     Heart sounds: No murmur heard. Pulmonary:     Effort: Pulmonary effort is normal.     Breath sounds: Normal breath sounds. No wheezing.  Abdominal:     General: Bowel sounds are normal.     Palpations: Abdomen is soft. There is no mass.     Tenderness: There is abdominal tenderness (RUQ pain). There is no right CVA tenderness or left CVA tenderness.  Musculoskeletal:        General: Normal range of motion.     Cervical back: No tenderness.     Right lower leg: No edema.     Left lower leg: No edema.  Neurological:     Gait: Gait normal.     Deep Tendon Reflexes: Reflexes normal.  Psychiatric:        Mood and Affect: Mood normal.        Behavior: Behavior normal.        Thought Content: Thought content normal.    BP 110/78    Pulse 68    Temp 99 F (37.2 C) (Oral)    Resp 16    Ht _0  (1.778 m)    Wt 221 lb (100.2 kg)    SpO2 98%    BMI 31.71 kg/m  Wt Readings from Last 3 Encounters:  10/20/21 221 lb (100.2 kg)  07/20/21 221 lb (100.2 kg)  03/18/21 235 lb (106.6 kg)    There are no preventive care reminders to display for this patient.  There are no preventive care reminders to display for this patient.   Lab Results  Component Value Date   TSH 3.440 07/20/2021   Lab  Results  Component Value Date   WBC 7.1 07/20/2021   HGB 12.7 (L) 07/20/2021   HCT 39.1 07/20/2021   MCV 86 07/20/2021   PLT 317 07/20/2021  Lab Results  Component Value Date   NA 142 07/20/2021   K 4.0 07/20/2021   CO2 25 07/20/2021   GLUCOSE 84 07/20/2021   BUN 17 07/20/2021   CREATININE 1.35 (H) 07/20/2021   BILITOT 0.4 07/20/2021   ALKPHOS 122 (H) 07/20/2021   AST 14 07/20/2021   ALT 10 07/20/2021   PROT 6.3 07/20/2021   ALBUMIN 3.9 07/20/2021   CALCIUM 8.7 07/20/2021   EGFR 62 07/20/2021   Lab Results  Component Value Date   CHOL 158 07/20/2021   Lab Results  Component Value Date   HDL 37 (L) 07/20/2021   Lab Results  Component Value Date   LDLCALC 108 (H) 07/20/2021   Lab Results  Component Value Date   TRIG 67 07/20/2021   Lab Results  Component Value Date   CHOLHDL 4.3 07/20/2021   No results found for: HGBA1C     Assessment & Plan:   Problem List Items Addressed This Visit   None Visit Diagnoses     RUQ pain    -  Primary   Relevant Orders   CBC with Differential/Platelet   Comprehensive metabolic panel   US Abdomen Limited RUQ (LIVER/GB) Patient is having new onset of right upper quadrant abdominal pain has no nausea or vomiting he is acutely tender in the right upper quadrant and has a positive Murphy sign.  We will get blood work on him and get a limited ultrasound of the right upper quadrant.        Orders Placed This Encounter  Procedures   US Abdomen Limited RUQ (LIVER/GB)   CBC with Differential/Platelet   Comprehensive metabolic panel     Follow-up: Return in about 1 week (around 10/27/2021) for abdominal pain.  I,Elivia Robotham,acting as a scribe for Reinaldo Meeker, MD.,have documented all relevant documentation on the behalf of Reinaldo Meeker, MD,as directed by  Reinaldo Meeker, MD while in the presence of Reinaldo Meeker, MD.   An After Visit Summary was printed and given to the patient.  Reinaldo Meeker, MD [facility] [phone]

## 2021-10-21 ENCOUNTER — Telehealth: Payer: Self-pay | Admitting: Family Medicine

## 2021-10-21 LAB — CBC WITH DIFFERENTIAL/PLATELET
Basophils Absolute: 0.1 10*3/uL (ref 0.0–0.2)
Basos: 1 %
EOS (ABSOLUTE): 0.2 10*3/uL (ref 0.0–0.4)
Eos: 2 %
Hematocrit: 38.1 % (ref 37.5–51.0)
Hemoglobin: 12.8 g/dL — ABNORMAL LOW (ref 13.0–17.7)
Immature Grans (Abs): 0 10*3/uL (ref 0.0–0.1)
Immature Granulocytes: 1 %
Lymphocytes Absolute: 1.9 10*3/uL (ref 0.7–3.1)
Lymphs: 23 %
MCH: 26.6 pg (ref 26.6–33.0)
MCHC: 33.6 g/dL (ref 31.5–35.7)
MCV: 79 fL (ref 79–97)
Monocytes Absolute: 0.7 10*3/uL (ref 0.1–0.9)
Monocytes: 9 %
Neutrophils Absolute: 5.5 10*3/uL (ref 1.4–7.0)
Neutrophils: 64 %
Platelets: 355 10*3/uL (ref 150–450)
RBC: 4.81 x10E6/uL (ref 4.14–5.80)
RDW: 14.3 % (ref 11.6–15.4)
WBC: 8.3 10*3/uL (ref 3.4–10.8)

## 2021-10-21 LAB — COMPREHENSIVE METABOLIC PANEL
ALT: 13 IU/L (ref 0–44)
AST: 13 IU/L (ref 0–40)
Albumin/Globulin Ratio: 1.6 (ref 1.2–2.2)
Albumin: 4.2 g/dL (ref 3.8–4.9)
Alkaline Phosphatase: 121 IU/L (ref 44–121)
BUN/Creatinine Ratio: 11 (ref 9–20)
BUN: 24 mg/dL (ref 6–24)
Bilirubin Total: 0.2 mg/dL (ref 0.0–1.2)
CO2: 26 mmol/L (ref 20–29)
Calcium: 8.9 mg/dL (ref 8.7–10.2)
Chloride: 101 mmol/L (ref 96–106)
Creatinine, Ser: 2.11 mg/dL — ABNORMAL HIGH (ref 0.76–1.27)
Globulin, Total: 2.6 g/dL (ref 1.5–4.5)
Glucose: 75 mg/dL (ref 70–99)
Potassium: 4.4 mmol/L (ref 3.5–5.2)
Sodium: 140 mmol/L (ref 134–144)
Total Protein: 6.8 g/dL (ref 6.0–8.5)
eGFR: 36 mL/min/{1.73_m2} — ABNORMAL LOW (ref >59)

## 2021-10-21 NOTE — Telephone Encounter (Signed)
° °  Tyrone Wilson has been scheduled for the following appointment:  WHAT: ULTRASOUND GB/RUQ WHERE: [facility] DATE: 10/23/21 TIME: 10:30 AM ARRIVAL TIME  A message has been left for the patient. NPO FOR 6 HRS PRIOR TO APPOINTMENT

## 2021-10-21 NOTE — Progress Notes (Signed)
Mild anemia, creatinine 2.11, eGFR 36, stage 3b, creatinine was 1.31 last year, recommend nephrology consul for changes, liver tests normal,  lp

## 2021-10-23 ENCOUNTER — Encounter (HOSPITAL_COMMUNITY): Payer: Self-pay

## 2021-10-23 ENCOUNTER — Other Ambulatory Visit: Payer: Self-pay | Admitting: Family Medicine

## 2021-10-23 ENCOUNTER — Emergency Department (HOSPITAL_COMMUNITY): Payer: Federal, State, Local not specified - PPO

## 2021-10-23 ENCOUNTER — Telehealth: Payer: Self-pay | Admitting: Family Medicine

## 2021-10-23 ENCOUNTER — Emergency Department (HOSPITAL_COMMUNITY)
Admission: EM | Admit: 2021-10-23 | Discharge: 2021-10-23 | Disposition: A | Discharge status: Home / Self Care | Payer: Federal, State, Local not specified - PPO | Attending: Emergency Medicine | Admitting: Emergency Medicine

## 2021-10-23 ENCOUNTER — Other Ambulatory Visit: Payer: Self-pay

## 2021-10-23 DIAGNOSIS — K8020 Calculus of gallbladder without cholecystitis without obstruction: Secondary | ICD-10-CM | POA: Insufficient documentation

## 2021-10-23 DIAGNOSIS — N1339 Other hydronephrosis: Secondary | ICD-10-CM

## 2021-10-23 DIAGNOSIS — R1011 Right upper quadrant pain: Secondary | ICD-10-CM

## 2021-10-23 DIAGNOSIS — N2889 Other specified disorders of kidney and ureter: Secondary | ICD-10-CM

## 2021-10-23 DIAGNOSIS — N1330 Unspecified hydronephrosis: Secondary | ICD-10-CM | POA: Diagnosis not present

## 2021-10-23 DIAGNOSIS — N179 Acute kidney failure, unspecified: Secondary | ICD-10-CM | POA: Diagnosis not present

## 2021-10-23 DIAGNOSIS — R319 Hematuria, unspecified: Secondary | ICD-10-CM | POA: Diagnosis not present

## 2021-10-23 HISTORY — DX: Gastro-esophageal reflux disease without esophagitis: K21.9

## 2021-10-23 LAB — CBC WITH DIFFERENTIAL/PLATELET
Abs Immature Granulocytes: 0.03 10*3/uL (ref 0.00–0.07)
Basophils Absolute: 0.1 10*3/uL (ref 0.0–0.1)
Basophils Relative: 1 %
Eosinophils Absolute: 0.1 10*3/uL (ref 0.0–0.5)
Eosinophils Relative: 1 %
HCT: 40.6 % (ref 39.0–52.0)
Hemoglobin: 12.8 g/dL — ABNORMAL LOW (ref 13.0–17.0)
Immature Granulocytes: 0 %
Lymphocytes Relative: 20 %
Lymphs Abs: 1.5 10*3/uL (ref 0.7–4.0)
MCH: 26.4 pg (ref 26.0–34.0)
MCHC: 31.5 g/dL (ref 30.0–36.0)
MCV: 83.7 fL (ref 80.0–100.0)
Monocytes Absolute: 0.6 10*3/uL (ref 0.1–1.0)
Monocytes Relative: 8 %
Neutro Abs: 5.2 10*3/uL (ref 1.7–7.7)
Neutrophils Relative %: 70 %
Platelets: 350 10*3/uL (ref 150–400)
RBC: 4.85 MIL/uL (ref 4.22–5.81)
RDW: 15 % (ref 11.5–15.5)
WBC: 7.5 10*3/uL (ref 4.0–10.5)
nRBC: 0 % (ref 0.0–0.2)

## 2021-10-23 LAB — COMPREHENSIVE METABOLIC PANEL
ALT: 15 U/L (ref 0–44)
AST: 15 U/L (ref 15–41)
Albumin: 3.6 g/dL (ref 3.5–5.0)
Alkaline Phosphatase: 95 U/L (ref 38–126)
Anion gap: 9 (ref 5–15)
BUN: 27 mg/dL — ABNORMAL HIGH (ref 6–20)
CO2: 27 mmol/L (ref 22–32)
Calcium: 9 mg/dL (ref 8.9–10.3)
Chloride: 103 mmol/L (ref 98–111)
Creatinine, Ser: 2.05 mg/dL — ABNORMAL HIGH (ref 0.61–1.24)
GFR, Estimated: 37 mL/min — ABNORMAL LOW (ref >60)
Glucose, Bld: 87 mg/dL (ref 70–99)
Potassium: 3.6 mmol/L (ref 3.5–5.1)
Sodium: 139 mmol/L (ref 135–145)
Total Bilirubin: 0.6 mg/dL (ref 0.3–1.2)
Total Protein: 7.6 g/dL (ref 6.5–8.1)

## 2021-10-23 LAB — LIPASE, BLOOD: Lipase: 35 U/L (ref 11–51)

## 2021-10-23 MED ORDER — IOHEXOL 300 MG/ML  SOLN
80.0000 mL | Freq: Once | INTRAMUSCULAR | Status: AC | PRN
  Administered 2021-10-23: 80 mL via INTRAVENOUS

## 2021-10-23 MED ORDER — SODIUM CHLORIDE 0.9 % IV BOLUS
1000.0000 mL | Freq: Once | INTRAVENOUS | Status: AC
  Administered 2021-10-23: 1000 mL via INTRAVENOUS

## 2021-10-23 NOTE — Telephone Encounter (Signed)
Patient is a 56 year old male who presented 2 days ago here at the office for right upper quadrant abdominal pain concerning for gallstones or biliary dyskinesia.  Dr. Henrene Pastor saw him in and a renal ultrasound which showed numerous gallstones.  It also showed a right renal mass that is causing hydronephrosis.  The mass is 7.5 cm in diameter is approximately.  I have recommended a pre and postcontrast hematuria protocol CT scan.  I discussed this with the patient however had to call him back as I saw that his kidney function had significantly worsened since the fall.  As such I feel like he is having obstructive kidney failure.  I recommend the patient go to [facility] emergency department for further evaluation.  He will need to see general surgery for gallstones but also urology based on what the CT scan shows.  I see you also need to have his kidney function reassessed. The pt is aware and agrees with plan.

## 2021-10-23 NOTE — ED Notes (Signed)
Patient transported to CT 

## 2021-10-23 NOTE — Progress Notes (Signed)
Pt redirected to [facility] after speaking with [facility] ED. kc

## 2021-10-23 NOTE — Discharge Instructions (Addendum)
As we discussed, it is very important that you maintain adequate hydration over the weekend and call urology to schedule an appointment ASAP for further evaluation and management of your CT scan findings. [facility] recommened that you call [facility] urology in [facility] as these specialists will need to manage your symptoms further. As discussed, you CT scan read that you have a 9.8 cm mass in your right kidney that is concerning for renal cell carcinoma with impairment in your kidney function.   Additionally, you have gallstones in your gallbladder without evidence of an obstruction. I have given you a referral to general surgery for you to call and schedule an appointment for further management of this.  Return if development of any new or worsening symptoms.  It was a pleasure to be involved with your care today.

## 2021-10-23 NOTE — ED Provider Notes (Signed)
[facility] DEPT Provider Note   CSN: [id number] Arrival date & time: 10/23/21  1502     History  Chief Complaint  Patient presents with   Abdominal Pain    Tyrone Wilson is a 56 y.o. male.  Patient with history of hypertension presents today for further evaluation after having a RUQ [place] outpatient today. Ultrasound showed multiple gallstones, measuring up to 1.7 cm, without signs of acute cholecystitis at this time, but also showed a 7.5 cm heterogeneous mass within the left kidney with associated hydronephrosis and from there it was recommended that he go to the hospital for emergent CT scan in the presence of these findings with worsening renal function. Patient endorses that he has very mild right upper quadrant pain that radiates to his back. States that this has been ongoing for almost 2 years, however he suspected that it was related to MSK overuse given history of heavy lifting. Does endorse associated unplanned weight loss of about 15 pounds in the past 3 weeks. Denies any difficulty urinating or any flank pain.  The history is provided by the patient. No language interpreter was used.  Abdominal Pain Associated symptoms: no chills, no diarrhea, no dysuria, no fever, no hematuria, no nausea and no vomiting       Home Medications Prior to Admission medications   Medication Sig Start Date End Date Taking? Authorizing Provider  BD DISP NEEDLE 23G X 1" MISC USE 1 NEEDLE BY DOSE EVERY 14 DAYS 07/20/21   [provider]  Cholecalciferol (VITAMIN D) 50 MCG (2000 UT) tablet Take 2,000 Units by mouth daily.    [provider]  esomeprazole (NEXIUM) 20 MG capsule Take 20 mg by mouth daily at 12 noon.    [provider]  Multiple Vitamin (MULTIVITAMIN) tablet Take 1 tablet by mouth daily.    [provider]  Potassium 99 MG TABS Take by mouth.    [provider]  Syringe/Needle, Disp, (SYRINGE 3CC/23GX1") 23G X  1" 3 ML MISC 1 each by Does not apply route every 14 (fourteen) days. 07/20/21   Cox, Elnita Maxwell, MD  testosterone cypionate (DEPOTESTOSTERONE CYPIONATE) 200 MG/ML injection INJECT 1.25 ML INTO THE MUSCLE EVERY 2 WEEKS 07/21/21   Cox, Kirsten, MD  valsartan-hydrochlorothiazide (DIOVAN-HCT) 320-25 MG tablet TAKE 1 TABLET BY MOUTH DAILY 08/03/21   Rochel Brome, MD      Allergies    Patient has no known allergies.    Review of Systems   Review of Systems  Constitutional:  Negative for chills and fever.  Gastrointestinal:  Positive for abdominal pain. Negative for diarrhea, nausea and vomiting.  Genitourinary:  Negative for decreased urine volume, difficulty urinating, dysuria, enuresis, flank pain, frequency, hematuria and urgency.  All other systems reviewed and are negative.  Physical Exam Updated Vital Signs BP (!) 153/99    Pulse 86    Temp 98.4 F (36.9 C) (Oral)    Resp 17    Ht 5\' 10"  (1.778 m)    Wt 95.7 kg    SpO2 97%    BMI 30.28 kg/m  Physical Exam Vitals and nursing note reviewed.  Constitutional:      General: He is not in acute distress.    Appearance: He is well-developed and normal weight. He is not ill-appearing, toxic-appearing or diaphoretic.     Comments: Patient resting comfortably in bed in no acute distress  Cardiovascular:     Rate and Rhythm: Normal rate and regular rhythm.  Heart sounds: Normal heart sounds.  Pulmonary:     Effort: Pulmonary effort is normal. No respiratory distress.     Breath sounds: Normal breath sounds.  Abdominal:     General: Abdomen is flat.     Palpations: Abdomen is soft.     Tenderness: There is abdominal tenderness in the right upper quadrant. There is no right CVA tenderness or left CVA tenderness.  Skin:    General: Skin is warm and dry.  Neurological:     General: No focal deficit present.     Mental Status: He is alert.    ED Results / Procedures / Treatments   Labs (all labs ordered are listed, but only abnormal  results are displayed) Labs Reviewed  COMPREHENSIVE METABOLIC PANEL - Abnormal; Notable for the following components:      Result Value   BUN 27 (*)    Creatinine, Ser 2.05 (*)    GFR, Estimated 37 (*)    All other components within normal limits  CBC WITH DIFFERENTIAL/PLATELET - Abnormal; Notable for the following components:   Hemoglobin 12.8 (*)    All other components within normal limits  LIPASE, BLOOD    EKG None  Radiology CT ABDOMEN PELVIS W CONTRAST  Result Date: 10/23/2021 CLINICAL DATA:  Renal mass/cyst. Indeterminate microscopic hematuria. Increased risk for urinary tract malignancy. EXAM: CT ABDOMEN AND PELVIS WITH CONTRAST TECHNIQUE: Multidetector CT imaging of the abdomen and pelvis was performed using the standard protocol following bolus administration of intravenous contrast. RADIATION DOSE REDUCTION: This exam was performed according to the departmental dose-optimization program which includes automated exposure control, adjustment of the mA and/or kV according to patient size and/or use of iterative reconstruction technique. CONTRAST:  58mL OMNIPAQUE IOHEXOL 300 MG/ML  SOLN COMPARISON:  Right upper quadrant abdominal ultrasound 10/23/2021 FINDINGS: Lower chest: Lung bases are unremarkable. Hepatobiliary: Smooth liver contours. No focal liver mass is identified. Gallstones are again seen within the gallbladder. No definite gallbladder wall thickening. No intrahepatic or extrahepatic biliary ductal dilatation. Pancreas: No mass or inflammatory fat stranding. No pancreatic ductal dilatation is seen. Spleen: Normal in size without focal abnormality. Adrenals/Urinary Tract: Adrenal glands are unremarkable. The left kidney enhances uniformly. There is mildly delayed enhancement of the right kidney. On delayed phase images, contrast opacifies the left renal collecting system and proximal left ureter but there is no contrast excretion yet within the right kidney. There is moderate to  high-grade right hydronephrosis. The right ureter appears to be normal in caliber. The hydronephrosis is likely secondary to a peripherally solid enhancing and centrally low-density likely necrotic mass within the anterior aspect of the lower pole of the right kidney, measuring up to approximately 8.5 x 7.1 x 9.8 cm (transverse by AP by craniocaudal). The enhancing soft tissue of this mass does appear to be partially invading the right renal vein (coronal image 78, axial series 2 image 33). This mass exerts high-grade mass effect on the inferior right renal pelvis and the ureteropelvic junction, and appears to be the cause for at least partial obstruction at the right ureteropelvic junction. The urinary bladder is grossly unremarkable. Stomach/Bowel: Mild sigmoid diverticulosis. No focal bowel wall thickening. The terminal ileum is unremarkable. The appendix appears within normal limits. There is surgical suture in the region of the stomach, possibly from gastric sleeve resection. No dilated loops of bowel to indicate bowel obstruction. Vascular/Lymphatic: No abdominal aortic aneurysm. There is a 1.5 cm short axis aortocaval lymph node (axial series 2, image 26). There  are scattered small subcentimeter para-aortic lymph nodes, nonspecific. Reproductive: The prostate and seminal vesicles are grossly unremarkable. Other: No abdominal wall hernia or abnormality.No abdominopelvic ascites. No pneumoperitoneum. Musculoskeletal: Minimal retrolisthesis of L2 on L3 and L1 on L2. Mild L1-2 disc space narrowing. Mild-to-moderate bilateral hip osteoarthritis. No aggressive lytic or blastic osseous lesion is seen. IMPRESSION:: IMPRESSION: 1. Large heterogeneous and likely centrally necrotic soft tissue mass within the anterior inferior aspect of the right kidney measuring up to 9.8 cm concerning for renal cell carcinoma. This appears to invade the adjacent right renal vein, possibly the cause for delayed enhancement of the right  kidney parenchyma. The mass also at least partially obstructs the right ureteropelvic junction, and there is moderate to high-grade right hydronephrosis with normal caliber of the right ureter. 2. There is a 1.5 cm short axis aortocaval lymph node. This is nonspecific. Given the right renal mass suspicious for malignancy, cannot exclude malignant involvement. 3. Cholelithiasis without evidence of acute cholecystitis. 4. Mild sigmoid diverticulosis. Electronically Signed   By: Yvonne Kendall M.D.   On: 10/23/2021 17:52    Procedures Procedures    Medications Ordered in ED Medications  iohexol (OMNIPAQUE) 300 MG/ML solution 80 mL (80 mLs Intravenous Contrast Given 10/23/21 1726)  sodium chloride 0.9 % bolus 1,000 mL (1,000 mLs Intravenous New Bag/Given (Non-Interop) 10/23/21 1824)    ED Course/ Medical Decision Making/ A&P                           Medical Decision Making Risk Prescription drug management.   This patient presents to the ED with referral from PCP for evaluation of a renal mass found on RUQ [place] today   Co morbidities that complicate the patient evaluation  hypertension    Lab Tests:  I Ordered, and personally interpreted labs.  The pertinent results include:  Creatinine elevation at 2.05, was 2.11 3 days ago, 1.35 3 months ago. BUN 27. No leukocytosis, anemia per baseline at 12.8   Imaging Studies ordered:  I ordered imaging studies including CT abdomen pelvis with contrast  I independently visualized and interpreted imaging which showed  1. Large heterogeneous and likely centrally necrotic soft tissue mass within the anterior inferior aspect of the right kidney measuring up to 9.8 cm concerning for renal cell carcinoma. This appears to invade the adjacent right renal vein, possibly the cause for delayed enhancement of the right kidney parenchyma. The mass also at least partially obstructs the right ureteropelvic junction, and there is moderate to high-grade right  hydronephrosis with normal caliber of the right ureter. 2. There is a 1.5 cm short axis aortocaval lymph node. This is nonspecific. Given the right renal mass suspicious for malignancy, cannot exclude malignant involvement. 3. Cholelithiasis without evidence of acute cholecystitis. 4. Mild sigmoid diverticulosis. I agree with the radiologist interpretation   Medicines ordered and prescription drug management:  I ordered medication including liter bolus NS  for AKI  Reevaluation of the patient after these medicines showed that the patient stayed the same I have reviewed the patients home medicines and have made adjustments as needed    Consultations Obtained:  I requested consultation with Urology Dr. Lovena Neighbours,  and discussed lab and imaging findings as well as pertinent plan - they recommend: inpatient management of AKI, however they deny any emergent concerns for intervention, states that patient will likely require transfer to [facility] or [facility] of nephrectomy with vascular involvement   Reevaluation:  After the  interventions noted above, I reevaluated the patient and found that they have :stayed the same   Dispostion:  After consideration of the diagnostic results and the patients response to treatment, I feel that the patent would benefit from inpatient rehydration and urology consult. However, I have discussed thoroughly with patient his findings and he states that he would like to go home and follow-up outpatient or return if his symptoms worsen as it is his wifes birthday and they have weekend plans. I discussed risks vs benefits with him and he states that he feels like he can rehydrate adequately at home. I then called Dr. Lovena Neighbours with Urology back who states that there are no emergent concerns related to the patients CT findings. He feels that the patient can adequately manage with oral hydration and calling Urology for an appointment ASAP on Monday. He recommends that Mr. Bruemmer  call Dr. Jaymes Graff, with [facility] for further management. Discussed this with patient who is amenable with plan. Will also give referral to general surgery for management of his cholelithiasis. He is understanding, educated on red flag symptoms that would prompt immediate return. Discharged in stable condition.   This is a shared visit with supervising physician Dr. Karle Starch who has independently evaluated patient & provided guidance in evaluation/management/disposition, in agreement with care    Final Clinical Impression(s) / ED Diagnoses Final diagnoses:  Right upper quadrant abdominal pain  Calculus of gallbladder without cholecystitis without obstruction  Renal mass, right  AKI (acute kidney injury) ([place])    Rx / DC Orders ED Discharge Orders     None     An After Visit Summary was printed and given to the patient.     Nestor Lewandowsky 10/23/21 2314    Truddie Hidden, MD 10/23/21 [phone]

## 2021-10-23 NOTE — ED Provider Triage Note (Signed)
Emergency Medicine Provider Triage Evaluation Note  Kasim Mccorkle , a 56 y.o. male  was evaluated in triage.  Patient had an outpatient right upper quadrant ultrasound today and was sent to the ED for further evaluation.  Ultrasound showed multiple gallstones, measuring up to 1.7 cm, without signs of acute cholecystitis at this time, but also showed a 7.5 cm heterogeneous mass within the left kidney with associated hydronephrosis.  Patient has also had some worsening renal function recently.  Sent in for CT and further evaluation.  Currently reports very mild right upper quadrant pain  Review of Systems  Positive: Abdominal pain Negative: Fevers, vomiting  Physical Exam  BP (!) 141/96 (BP Location: Left Arm)    Pulse 96    Temp 98.4 F (36.9 C) (Oral)    Resp 16    Ht 5\' 10"  (1.778 m)    Wt 95.7 kg    SpO2 99%    BMI 30.28 kg/m  Gen:   Awake, no distress   Resp:  Normal effort  MSK:   Moves extremities without difficulty  Other:  Mild right upper quadrant tenderness, no focal flank tenderness  Medical Decision Making  Medically screening exam initiated at 3:11 PM.  Appropriate orders placed.  Quantavius Humm was informed that the remainder of the evaluation will be completed by another provider, this initial triage assessment does not replace that evaluation, and the importance of remaining in the ED until their evaluation is complete.  Will recheck lab work including renal function as this is been steadily worsening over the past few months and will order CT, ideally this should be done with pre and postcontrast hematuria protocol, but depending on patient's GFR he may not be able to receive IV contrast   Jacqlyn Larsen, PA-C 10/23/21 1524

## 2021-10-23 NOTE — ED Notes (Signed)
Pt care taken, wants to go home, said that the pa was getting paperwork and referrals together.  Vs wnl.

## 2021-10-23 NOTE — ED Triage Notes (Signed)
Patient reports that he has had RUQ pain x 2 weeks. Patient reports that he had an US of the abdomen today. Patient states that it showed gall stones and that he had  kidney swelling and a lesion of the right kidney and told the patient that he needed a CT scan.

## 2021-10-26 ENCOUNTER — Other Ambulatory Visit: Payer: Self-pay | Admitting: Family Medicine

## 2021-10-26 ENCOUNTER — Ambulatory Visit: Payer: Federal, State, Local not specified - PPO | Admitting: Family Medicine

## 2021-10-26 ENCOUNTER — Telehealth: Payer: Self-pay

## 2021-10-26 DIAGNOSIS — C641 Malignant neoplasm of right kidney, except renal pelvis: Secondary | ICD-10-CM

## 2021-10-26 NOTE — Telephone Encounter (Signed)
I have a call in to Dr. Thurmond Butts. He is supposed to call me back on my cell.

## 2021-10-26 NOTE — Telephone Encounter (Signed)
Tyrone Wilson called in regards to all the info he received on Friday, pt tried to call Dr. Thurmond Butts office for an appt they said they didn't see anything on him and he needed a referral, Tyrone Wilson also stated that he would like to have kidney and gallbladder removed, no chemo treatment or anything like that. Tyrone Wilson wants both removed at the same time if possible. Dr. Tobie Poet please advise asap. Thank you.

## 2021-10-26 NOTE — Telephone Encounter (Signed)
Tyrone Wilson called with concerns about his Urgent referral to [facility].  Dr. Tobie Poet is going to call and talk with the physician about getting him in ASAP.

## 2021-10-27 DIAGNOSIS — E278 Other specified disorders of adrenal gland: Secondary | ICD-10-CM | POA: Diagnosis not present

## 2021-10-27 DIAGNOSIS — N2889 Other specified disorders of kidney and ureter: Secondary | ICD-10-CM | POA: Diagnosis not present

## 2021-10-27 NOTE — Telephone Encounter (Signed)
I spoke with Dr. Carl Best about the patient's case on Monday night.  He will speak with the staff on Tuesday and get him an appointment as early as he can.  Patient needs an MRI of his abdomen with and without contrast as well as a CT of his chest without contrast.  We have ordered these.  I have discussed with the patient his situation.  He expressed understanding.  If he develops any shortness of breath or swelling in legs I was recommend he go to the [facility] emergency department.

## 2021-10-29 DIAGNOSIS — I10 Essential (primary) hypertension: Secondary | ICD-10-CM | POA: Diagnosis not present

## 2021-10-29 DIAGNOSIS — M549 Dorsalgia, unspecified: Secondary | ICD-10-CM | POA: Diagnosis not present

## 2021-10-29 DIAGNOSIS — Z79899 Other long term (current) drug therapy: Secondary | ICD-10-CM | POA: Diagnosis not present

## 2021-10-29 DIAGNOSIS — E7800 Pure hypercholesterolemia, unspecified: Secondary | ICD-10-CM | POA: Diagnosis not present

## 2021-10-29 DIAGNOSIS — N2889 Other specified disorders of kidney and ureter: Secondary | ICD-10-CM | POA: Diagnosis not present

## 2021-11-02 ENCOUNTER — Ambulatory Visit (HOSPITAL_COMMUNITY)
Admission: RE | Admit: 2021-11-02 | Discharge: 2021-11-02 | Disposition: A | Discharge status: Home / Self Care | Payer: Federal, State, Local not specified - PPO | Source: Ambulatory Visit | Attending: Family Medicine | Admitting: Family Medicine

## 2021-11-02 ENCOUNTER — Other Ambulatory Visit: Payer: Self-pay

## 2021-11-02 DIAGNOSIS — C641 Malignant neoplasm of right kidney, except renal pelvis: Secondary | ICD-10-CM

## 2021-11-02 DIAGNOSIS — N1330 Unspecified hydronephrosis: Secondary | ICD-10-CM | POA: Diagnosis not present

## 2021-11-02 DIAGNOSIS — K8020 Calculus of gallbladder without cholecystitis without obstruction: Secondary | ICD-10-CM | POA: Diagnosis not present

## 2021-11-02 DIAGNOSIS — K449 Diaphragmatic hernia without obstruction or gangrene: Secondary | ICD-10-CM | POA: Diagnosis not present

## 2021-11-02 DIAGNOSIS — R911 Solitary pulmonary nodule: Secondary | ICD-10-CM | POA: Diagnosis not present

## 2021-11-02 MED ORDER — GADOBUTROL 1 MMOL/ML IV SOLN
10.0000 mL | Freq: Once | INTRAVENOUS | Status: AC | PRN
  Administered 2021-11-02: 10 mL via INTRAVENOUS

## 2021-11-04 DIAGNOSIS — R918 Other nonspecific abnormal finding of lung field: Secondary | ICD-10-CM | POA: Diagnosis not present

## 2021-11-04 DIAGNOSIS — R590 Localized enlarged lymph nodes: Secondary | ICD-10-CM | POA: Diagnosis not present

## 2021-11-04 DIAGNOSIS — N2889 Other specified disorders of kidney and ureter: Secondary | ICD-10-CM | POA: Diagnosis not present

## 2021-11-04 DIAGNOSIS — K8020 Calculus of gallbladder without cholecystitis without obstruction: Secondary | ICD-10-CM | POA: Diagnosis not present

## 2021-11-12 DIAGNOSIS — N2889 Other specified disorders of kidney and ureter: Secondary | ICD-10-CM | POA: Diagnosis not present

## 2021-11-12 DIAGNOSIS — N1330 Unspecified hydronephrosis: Secondary | ICD-10-CM | POA: Diagnosis not present

## 2021-11-12 DIAGNOSIS — C7989 Secondary malignant neoplasm of other specified sites: Secondary | ICD-10-CM | POA: Diagnosis not present

## 2021-11-12 DIAGNOSIS — D649 Anemia, unspecified: Secondary | ICD-10-CM | POA: Diagnosis not present

## 2021-11-12 DIAGNOSIS — D630 Anemia in neoplastic disease: Secondary | ICD-10-CM | POA: Diagnosis not present

## 2021-11-12 DIAGNOSIS — Z79899 Other long term (current) drug therapy: Secondary | ICD-10-CM | POA: Diagnosis not present

## 2021-11-12 DIAGNOSIS — Z903 Acquired absence of stomach [part of]: Secondary | ICD-10-CM | POA: Diagnosis not present

## 2021-11-12 DIAGNOSIS — I10 Essential (primary) hypertension: Secondary | ICD-10-CM | POA: Diagnosis not present

## 2021-11-12 DIAGNOSIS — C641 Malignant neoplasm of right kidney, except renal pelvis: Secondary | ICD-10-CM | POA: Diagnosis not present

## 2021-11-20 ENCOUNTER — Other Ambulatory Visit: Payer: Self-pay | Admitting: Family Medicine

## 2021-11-24 ENCOUNTER — Ambulatory Visit: Payer: Federal, State, Local not specified - PPO | Admitting: Family Medicine

## 2021-12-01 DIAGNOSIS — Z01812 Encounter for preprocedural laboratory examination: Secondary | ICD-10-CM | POA: Diagnosis not present

## 2021-12-03 DIAGNOSIS — Z6831 Body mass index (BMI) 31.0-31.9, adult: Secondary | ICD-10-CM | POA: Diagnosis not present

## 2021-12-03 DIAGNOSIS — C641 Malignant neoplasm of right kidney, except renal pelvis: Secondary | ICD-10-CM | POA: Diagnosis not present

## 2021-12-03 DIAGNOSIS — C7970 Secondary malignant neoplasm of unspecified adrenal gland: Secondary | ICD-10-CM | POA: Diagnosis not present

## 2021-12-03 DIAGNOSIS — K8020 Calculus of gallbladder without cholecystitis without obstruction: Secondary | ICD-10-CM | POA: Diagnosis not present

## 2021-12-03 DIAGNOSIS — C7989 Secondary malignant neoplasm of other specified sites: Secondary | ICD-10-CM | POA: Diagnosis not present

## 2021-12-03 DIAGNOSIS — N2889 Other specified disorders of kidney and ureter: Secondary | ICD-10-CM | POA: Diagnosis not present

## 2021-12-03 DIAGNOSIS — Z28310 Unvaccinated for covid-19: Secondary | ICD-10-CM | POA: Diagnosis not present

## 2021-12-03 DIAGNOSIS — I10 Essential (primary) hypertension: Secondary | ICD-10-CM | POA: Diagnosis not present

## 2021-12-03 DIAGNOSIS — E669 Obesity, unspecified: Secondary | ICD-10-CM | POA: Diagnosis not present

## 2021-12-03 DIAGNOSIS — C7919 Secondary malignant neoplasm of other urinary organs: Secondary | ICD-10-CM | POA: Diagnosis not present

## 2021-12-17 DIAGNOSIS — N1330 Unspecified hydronephrosis: Secondary | ICD-10-CM | POA: Diagnosis not present

## 2021-12-17 DIAGNOSIS — C641 Malignant neoplasm of right kidney, except renal pelvis: Secondary | ICD-10-CM | POA: Diagnosis not present

## 2021-12-17 DIAGNOSIS — D649 Anemia, unspecified: Secondary | ICD-10-CM | POA: Diagnosis not present

## 2021-12-17 DIAGNOSIS — Z905 Acquired absence of kidney: Secondary | ICD-10-CM | POA: Diagnosis not present

## 2021-12-17 DIAGNOSIS — C771 Secondary and unspecified malignant neoplasm of intrathoracic lymph nodes: Secondary | ICD-10-CM | POA: Diagnosis not present

## 2021-12-17 DIAGNOSIS — I951 Orthostatic hypotension: Secondary | ICD-10-CM | POA: Diagnosis not present

## 2021-12-17 DIAGNOSIS — I10 Essential (primary) hypertension: Secondary | ICD-10-CM | POA: Diagnosis not present

## 2021-12-17 DIAGNOSIS — N2889 Other specified disorders of kidney and ureter: Secondary | ICD-10-CM | POA: Diagnosis not present

## 2021-12-17 DIAGNOSIS — K8020 Calculus of gallbladder without cholecystitis without obstruction: Secondary | ICD-10-CM | POA: Diagnosis not present

## 2021-12-24 DIAGNOSIS — Z9884 Bariatric surgery status: Secondary | ICD-10-CM | POA: Diagnosis not present

## 2021-12-24 DIAGNOSIS — I452 Bifascicular block: Secondary | ICD-10-CM | POA: Diagnosis not present

## 2021-12-24 DIAGNOSIS — I129 Hypertensive chronic kidney disease with stage 1 through stage 4 chronic kidney disease, or unspecified chronic kidney disease: Secondary | ICD-10-CM | POA: Diagnosis not present

## 2021-12-24 DIAGNOSIS — N189 Chronic kidney disease, unspecified: Secondary | ICD-10-CM | POA: Diagnosis not present

## 2021-12-24 DIAGNOSIS — Z9049 Acquired absence of other specified parts of digestive tract: Secondary | ICD-10-CM | POA: Diagnosis not present

## 2021-12-24 DIAGNOSIS — R918 Other nonspecific abnormal finding of lung field: Secondary | ICD-10-CM | POA: Diagnosis not present

## 2021-12-24 DIAGNOSIS — Z79899 Other long term (current) drug therapy: Secondary | ICD-10-CM | POA: Diagnosis not present

## 2021-12-24 DIAGNOSIS — D649 Anemia, unspecified: Secondary | ICD-10-CM | POA: Diagnosis not present

## 2021-12-24 DIAGNOSIS — Z905 Acquired absence of kidney: Secondary | ICD-10-CM | POA: Diagnosis not present

## 2021-12-24 DIAGNOSIS — C649 Malignant neoplasm of unspecified kidney, except renal pelvis: Secondary | ICD-10-CM | POA: Diagnosis not present

## 2021-12-24 DIAGNOSIS — R9431 Abnormal electrocardiogram [ECG] [EKG]: Secondary | ICD-10-CM | POA: Diagnosis not present

## 2021-12-24 DIAGNOSIS — Z6830 Body mass index (BMI) 30.0-30.9, adult: Secondary | ICD-10-CM | POA: Diagnosis not present

## 2021-12-24 DIAGNOSIS — C641 Malignant neoplasm of right kidney, except renal pelvis: Secondary | ICD-10-CM | POA: Diagnosis not present

## 2021-12-24 DIAGNOSIS — C7800 Secondary malignant neoplasm of unspecified lung: Secondary | ICD-10-CM | POA: Diagnosis not present

## 2021-12-24 DIAGNOSIS — R911 Solitary pulmonary nodule: Secondary | ICD-10-CM | POA: Diagnosis not present

## 2021-12-24 DIAGNOSIS — Z006 Encounter for examination for normal comparison and control in clinical research program: Secondary | ICD-10-CM | POA: Diagnosis not present

## 2021-12-29 DIAGNOSIS — D508 Other iron deficiency anemias: Secondary | ICD-10-CM | POA: Diagnosis not present

## 2021-12-29 DIAGNOSIS — R1013 Epigastric pain: Secondary | ICD-10-CM | POA: Diagnosis not present

## 2021-12-29 DIAGNOSIS — Z09 Encounter for follow-up examination after completed treatment for conditions other than malignant neoplasm: Secondary | ICD-10-CM | POA: Diagnosis not present

## 2021-12-29 DIAGNOSIS — C641 Malignant neoplasm of right kidney, except renal pelvis: Secondary | ICD-10-CM | POA: Diagnosis not present

## 2022-01-04 DIAGNOSIS — K8020 Calculus of gallbladder without cholecystitis without obstruction: Secondary | ICD-10-CM | POA: Diagnosis not present

## 2022-01-04 DIAGNOSIS — N189 Chronic kidney disease, unspecified: Secondary | ICD-10-CM | POA: Diagnosis not present

## 2022-01-04 DIAGNOSIS — C641 Malignant neoplasm of right kidney, except renal pelvis: Secondary | ICD-10-CM | POA: Diagnosis not present

## 2022-01-04 DIAGNOSIS — I129 Hypertensive chronic kidney disease with stage 1 through stage 4 chronic kidney disease, or unspecified chronic kidney disease: Secondary | ICD-10-CM | POA: Diagnosis not present

## 2022-01-04 DIAGNOSIS — Z905 Acquired absence of kidney: Secondary | ICD-10-CM | POA: Diagnosis not present

## 2022-01-04 DIAGNOSIS — N1330 Unspecified hydronephrosis: Secondary | ICD-10-CM | POA: Diagnosis not present

## 2022-01-04 DIAGNOSIS — Z6831 Body mass index (BMI) 31.0-31.9, adult: Secondary | ICD-10-CM | POA: Diagnosis not present

## 2022-01-04 DIAGNOSIS — Z79899 Other long term (current) drug therapy: Secondary | ICD-10-CM | POA: Diagnosis not present

## 2022-01-04 DIAGNOSIS — N2889 Other specified disorders of kidney and ureter: Secondary | ICD-10-CM | POA: Diagnosis not present

## 2022-01-04 DIAGNOSIS — D631 Anemia in chronic kidney disease: Secondary | ICD-10-CM | POA: Diagnosis not present

## 2022-01-04 DIAGNOSIS — Z006 Encounter for examination for normal comparison and control in clinical research program: Secondary | ICD-10-CM | POA: Diagnosis not present

## 2022-01-20 DIAGNOSIS — C641 Malignant neoplasm of right kidney, except renal pelvis: Secondary | ICD-10-CM | POA: Diagnosis not present

## 2022-01-20 DIAGNOSIS — L299 Pruritus, unspecified: Secondary | ICD-10-CM | POA: Diagnosis not present

## 2022-01-20 DIAGNOSIS — Z0000 Encounter for general adult medical examination without abnormal findings: Secondary | ICD-10-CM | POA: Diagnosis not present

## 2022-01-20 DIAGNOSIS — R7989 Other specified abnormal findings of blood chemistry: Secondary | ICD-10-CM | POA: Diagnosis not present

## 2022-01-20 DIAGNOSIS — I10 Essential (primary) hypertension: Secondary | ICD-10-CM | POA: Diagnosis not present

## 2022-01-25 DIAGNOSIS — Z006 Encounter for examination for normal comparison and control in clinical research program: Secondary | ICD-10-CM | POA: Diagnosis not present

## 2022-01-25 DIAGNOSIS — D631 Anemia in chronic kidney disease: Secondary | ICD-10-CM | POA: Diagnosis not present

## 2022-01-25 DIAGNOSIS — C641 Malignant neoplasm of right kidney, except renal pelvis: Secondary | ICD-10-CM | POA: Diagnosis not present

## 2022-01-25 DIAGNOSIS — Z905 Acquired absence of kidney: Secondary | ICD-10-CM | POA: Diagnosis not present

## 2022-01-25 DIAGNOSIS — Z79899 Other long term (current) drug therapy: Secondary | ICD-10-CM | POA: Diagnosis not present

## 2022-01-25 DIAGNOSIS — Z5112 Encounter for antineoplastic immunotherapy: Secondary | ICD-10-CM | POA: Diagnosis not present

## 2022-01-25 DIAGNOSIS — I129 Hypertensive chronic kidney disease with stage 1 through stage 4 chronic kidney disease, or unspecified chronic kidney disease: Secondary | ICD-10-CM | POA: Diagnosis not present

## 2022-01-25 DIAGNOSIS — Z6830 Body mass index (BMI) 30.0-30.9, adult: Secondary | ICD-10-CM | POA: Diagnosis not present

## 2022-01-25 DIAGNOSIS — I10 Essential (primary) hypertension: Secondary | ICD-10-CM | POA: Diagnosis not present

## 2022-01-25 DIAGNOSIS — N189 Chronic kidney disease, unspecified: Secondary | ICD-10-CM | POA: Diagnosis not present

## 2022-02-02 DIAGNOSIS — C771 Secondary and unspecified malignant neoplasm of intrathoracic lymph nodes: Secondary | ICD-10-CM | POA: Diagnosis not present

## 2022-02-02 DIAGNOSIS — R918 Other nonspecific abnormal finding of lung field: Secondary | ICD-10-CM | POA: Diagnosis not present

## 2022-02-02 DIAGNOSIS — I452 Bifascicular block: Secondary | ICD-10-CM | POA: Diagnosis not present

## 2022-02-02 DIAGNOSIS — R9431 Abnormal electrocardiogram [ECG] [EKG]: Secondary | ICD-10-CM | POA: Diagnosis not present

## 2022-02-02 DIAGNOSIS — J90 Pleural effusion, not elsewhere classified: Secondary | ICD-10-CM | POA: Diagnosis not present

## 2022-02-02 DIAGNOSIS — R933 Abnormal findings on diagnostic imaging of other parts of digestive tract: Secondary | ICD-10-CM | POA: Diagnosis not present

## 2022-02-02 DIAGNOSIS — R079 Chest pain, unspecified: Secondary | ICD-10-CM | POA: Diagnosis not present

## 2022-02-02 DIAGNOSIS — Z6830 Body mass index (BMI) 30.0-30.9, adult: Secondary | ICD-10-CM | POA: Diagnosis not present

## 2022-02-04 DIAGNOSIS — I514 Myocarditis, unspecified: Secondary | ICD-10-CM | POA: Diagnosis not present

## 2022-02-04 DIAGNOSIS — Z79899 Other long term (current) drug therapy: Secondary | ICD-10-CM | POA: Diagnosis not present

## 2022-02-04 DIAGNOSIS — I129 Hypertensive chronic kidney disease with stage 1 through stage 4 chronic kidney disease, or unspecified chronic kidney disease: Secondary | ICD-10-CM | POA: Diagnosis not present

## 2022-02-04 DIAGNOSIS — C7800 Secondary malignant neoplasm of unspecified lung: Secondary | ICD-10-CM | POA: Diagnosis not present

## 2022-02-04 DIAGNOSIS — I308 Other forms of acute pericarditis: Secondary | ICD-10-CM | POA: Diagnosis not present

## 2022-02-04 DIAGNOSIS — C771 Secondary and unspecified malignant neoplasm of intrathoracic lymph nodes: Secondary | ICD-10-CM | POA: Diagnosis not present

## 2022-02-04 DIAGNOSIS — I491 Atrial premature depolarization: Secondary | ICD-10-CM | POA: Diagnosis not present

## 2022-02-04 DIAGNOSIS — I10 Essential (primary) hypertension: Secondary | ICD-10-CM | POA: Diagnosis not present

## 2022-02-04 DIAGNOSIS — Z6831 Body mass index (BMI) 31.0-31.9, adult: Secondary | ICD-10-CM | POA: Diagnosis not present

## 2022-02-04 DIAGNOSIS — R079 Chest pain, unspecified: Secondary | ICD-10-CM | POA: Diagnosis not present

## 2022-02-04 DIAGNOSIS — N183 Chronic kidney disease, stage 3 unspecified: Secondary | ICD-10-CM | POA: Diagnosis not present

## 2022-02-04 DIAGNOSIS — K219 Gastro-esophageal reflux disease without esophagitis: Secondary | ICD-10-CM | POA: Diagnosis not present

## 2022-02-04 DIAGNOSIS — I4510 Unspecified right bundle-branch block: Secondary | ICD-10-CM | POA: Diagnosis not present

## 2022-02-04 DIAGNOSIS — C641 Malignant neoplasm of right kidney, except renal pelvis: Secondary | ICD-10-CM | POA: Diagnosis not present

## 2022-02-04 DIAGNOSIS — Z9884 Bariatric surgery status: Secondary | ICD-10-CM | POA: Diagnosis not present

## 2022-02-04 DIAGNOSIS — I452 Bifascicular block: Secondary | ICD-10-CM | POA: Diagnosis not present

## 2022-02-04 DIAGNOSIS — I319 Disease of pericardium, unspecified: Secondary | ICD-10-CM | POA: Diagnosis not present

## 2022-02-05 DIAGNOSIS — I308 Other forms of acute pericarditis: Secondary | ICD-10-CM | POA: Diagnosis not present

## 2022-02-08 DIAGNOSIS — I319 Disease of pericardium, unspecified: Secondary | ICD-10-CM | POA: Diagnosis not present

## 2022-02-08 DIAGNOSIS — Z6831 Body mass index (BMI) 31.0-31.9, adult: Secondary | ICD-10-CM | POA: Diagnosis not present

## 2022-02-08 DIAGNOSIS — N183 Chronic kidney disease, stage 3 unspecified: Secondary | ICD-10-CM | POA: Diagnosis not present

## 2022-02-08 DIAGNOSIS — C641 Malignant neoplasm of right kidney, except renal pelvis: Secondary | ICD-10-CM | POA: Diagnosis not present

## 2022-02-08 DIAGNOSIS — N189 Chronic kidney disease, unspecified: Secondary | ICD-10-CM | POA: Diagnosis not present

## 2022-02-08 DIAGNOSIS — I1310 Hypertensive heart and chronic kidney disease without heart failure, with stage 1 through stage 4 chronic kidney disease, or unspecified chronic kidney disease: Secondary | ICD-10-CM | POA: Diagnosis not present

## 2022-02-08 DIAGNOSIS — I10 Essential (primary) hypertension: Secondary | ICD-10-CM | POA: Diagnosis not present

## 2022-02-10 DIAGNOSIS — R079 Chest pain, unspecified: Secondary | ICD-10-CM | POA: Diagnosis not present

## 2022-02-10 DIAGNOSIS — Z0000 Encounter for general adult medical examination without abnormal findings: Secondary | ICD-10-CM | POA: Diagnosis not present

## 2022-02-10 DIAGNOSIS — C641 Malignant neoplasm of right kidney, except renal pelvis: Secondary | ICD-10-CM | POA: Diagnosis not present

## 2022-02-10 DIAGNOSIS — R7989 Other specified abnormal findings of blood chemistry: Secondary | ICD-10-CM | POA: Diagnosis not present

## 2022-02-15 DIAGNOSIS — Z6831 Body mass index (BMI) 31.0-31.9, adult: Secondary | ICD-10-CM | POA: Diagnosis not present

## 2022-02-15 DIAGNOSIS — C641 Malignant neoplasm of right kidney, except renal pelvis: Secondary | ICD-10-CM | POA: Diagnosis not present

## 2022-02-15 DIAGNOSIS — Z006 Encounter for examination for normal comparison and control in clinical research program: Secondary | ICD-10-CM | POA: Diagnosis not present

## 2022-02-17 DIAGNOSIS — E782 Mixed hyperlipidemia: Secondary | ICD-10-CM | POA: Diagnosis not present

## 2022-02-18 DIAGNOSIS — I319 Disease of pericardium, unspecified: Secondary | ICD-10-CM | POA: Diagnosis not present

## 2022-02-19 DIAGNOSIS — I319 Disease of pericardium, unspecified: Secondary | ICD-10-CM | POA: Diagnosis not present

## 2022-02-19 DIAGNOSIS — N183 Chronic kidney disease, stage 3 unspecified: Secondary | ICD-10-CM | POA: Diagnosis not present

## 2022-02-19 DIAGNOSIS — I1310 Hypertensive heart and chronic kidney disease without heart failure, with stage 1 through stage 4 chronic kidney disease, or unspecified chronic kidney disease: Secondary | ICD-10-CM | POA: Diagnosis not present

## 2022-02-22 DIAGNOSIS — I319 Disease of pericardium, unspecified: Secondary | ICD-10-CM | POA: Diagnosis not present

## 2022-03-10 DIAGNOSIS — N183 Chronic kidney disease, stage 3 unspecified: Secondary | ICD-10-CM | POA: Diagnosis not present

## 2022-03-10 DIAGNOSIS — I319 Disease of pericardium, unspecified: Secondary | ICD-10-CM | POA: Diagnosis not present

## 2022-03-10 DIAGNOSIS — I1310 Hypertensive heart and chronic kidney disease without heart failure, with stage 1 through stage 4 chronic kidney disease, or unspecified chronic kidney disease: Secondary | ICD-10-CM | POA: Diagnosis not present

## 2022-03-10 DIAGNOSIS — C641 Malignant neoplasm of right kidney, except renal pelvis: Secondary | ICD-10-CM | POA: Diagnosis not present

## 2022-03-11 DIAGNOSIS — I319 Disease of pericardium, unspecified: Secondary | ICD-10-CM | POA: Diagnosis not present

## 2022-03-17 DIAGNOSIS — I341 Nonrheumatic mitral (valve) prolapse: Secondary | ICD-10-CM | POA: Diagnosis not present

## 2022-03-17 DIAGNOSIS — I361 Nonrheumatic tricuspid (valve) insufficiency: Secondary | ICD-10-CM | POA: Diagnosis not present

## 2022-03-17 DIAGNOSIS — I3139 Other pericardial effusion (noninflammatory): Secondary | ICD-10-CM | POA: Diagnosis not present

## 2022-03-29 DIAGNOSIS — R918 Other nonspecific abnormal finding of lung field: Secondary | ICD-10-CM | POA: Diagnosis not present

## 2022-03-29 DIAGNOSIS — C649 Malignant neoplasm of unspecified kidney, except renal pelvis: Secondary | ICD-10-CM | POA: Diagnosis not present

## 2022-03-29 DIAGNOSIS — C7900 Secondary malignant neoplasm of unspecified kidney and renal pelvis: Secondary | ICD-10-CM | POA: Diagnosis not present

## 2022-03-31 DIAGNOSIS — C641 Malignant neoplasm of right kidney, except renal pelvis: Secondary | ICD-10-CM | POA: Diagnosis not present

## 2022-03-31 DIAGNOSIS — E782 Mixed hyperlipidemia: Secondary | ICD-10-CM | POA: Diagnosis not present

## 2022-03-31 DIAGNOSIS — D500 Iron deficiency anemia secondary to blood loss (chronic): Secondary | ICD-10-CM | POA: Diagnosis not present

## 2022-03-31 DIAGNOSIS — I10 Essential (primary) hypertension: Secondary | ICD-10-CM | POA: Diagnosis not present

## 2022-04-12 DIAGNOSIS — I319 Disease of pericardium, unspecified: Secondary | ICD-10-CM | POA: Diagnosis not present

## 2022-04-12 DIAGNOSIS — C641 Malignant neoplasm of right kidney, except renal pelvis: Secondary | ICD-10-CM | POA: Diagnosis not present

## 2022-04-14 DIAGNOSIS — I319 Disease of pericardium, unspecified: Secondary | ICD-10-CM | POA: Diagnosis not present

## 2022-04-14 DIAGNOSIS — I10 Essential (primary) hypertension: Secondary | ICD-10-CM | POA: Diagnosis not present

## 2022-04-14 DIAGNOSIS — C641 Malignant neoplasm of right kidney, except renal pelvis: Secondary | ICD-10-CM | POA: Diagnosis not present

## 2022-04-14 DIAGNOSIS — N183 Chronic kidney disease, stage 3 unspecified: Secondary | ICD-10-CM | POA: Diagnosis not present

## 2022-04-22 DIAGNOSIS — I10 Essential (primary) hypertension: Secondary | ICD-10-CM | POA: Diagnosis not present

## 2022-04-22 DIAGNOSIS — C641 Malignant neoplasm of right kidney, except renal pelvis: Secondary | ICD-10-CM | POA: Diagnosis not present

## 2022-05-10 DIAGNOSIS — C641 Malignant neoplasm of right kidney, except renal pelvis: Secondary | ICD-10-CM | POA: Diagnosis not present

## 2022-05-10 DIAGNOSIS — I514 Myocarditis, unspecified: Secondary | ICD-10-CM | POA: Diagnosis not present

## 2022-05-10 DIAGNOSIS — Z6832 Body mass index (BMI) 32.0-32.9, adult: Secondary | ICD-10-CM | POA: Diagnosis not present

## 2022-05-10 DIAGNOSIS — T50905A Adverse effect of unspecified drugs, medicaments and biological substances, initial encounter: Secondary | ICD-10-CM | POA: Diagnosis not present

## 2022-05-19 DIAGNOSIS — I319 Disease of pericardium, unspecified: Secondary | ICD-10-CM | POA: Diagnosis not present

## 2022-05-19 DIAGNOSIS — N183 Chronic kidney disease, stage 3 unspecified: Secondary | ICD-10-CM | POA: Diagnosis not present

## 2022-05-19 DIAGNOSIS — I10 Essential (primary) hypertension: Secondary | ICD-10-CM | POA: Diagnosis not present

## 2022-05-19 DIAGNOSIS — N184 Chronic kidney disease, stage 4 (severe): Secondary | ICD-10-CM | POA: Diagnosis not present

## 2022-06-22 IMAGING — MR MR ELBOW*L* WO/W CM
8 series · 40 of 40 positions shown · IV contrast (multihance)
Comparison: None.

CLINICAL DATA: Left elbow pain radiating into the forearm for the
past month. No injury or prior surgery.

EXAM:
MRI OF THE LEFT ELBOW WITHOUT AND WITH CONTRAST
TECHNIQUE: Multiplanar, multisequence MR imaging of the elbow was performed
before and after the administration of intravenous contrast.
CONTRAST:  20mL MULTIHANCE GADOBENATE DIMEGLUMINE 529 MG/ML IV SOLN

[Series 3: T1 · axial · left · 3.5mm · 0.38mm/px · z∈[-39,+59]mm · 5 of 27 slices shown]
[im 1/27]
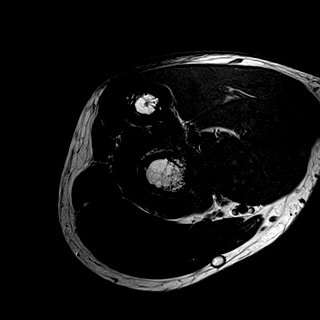
[im 7/27]
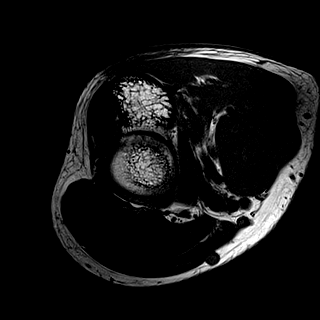
[im 14/27]
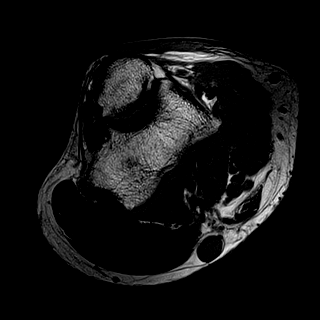
[im 20/27]
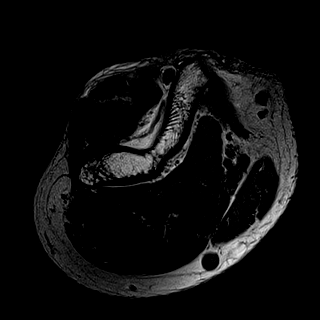
[im 27/27]
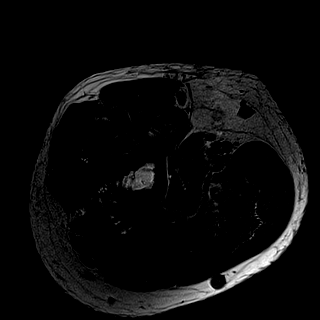

[Series 4: T2 fat-sat · axial · left · 3.5mm · 0.38mm/px · z∈[-39,+59]mm · 5 of 27 slices shown (1 of 2)]
[im 1/27]
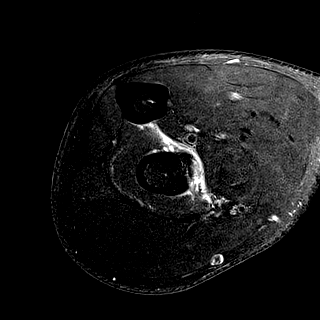
[im 7/27]
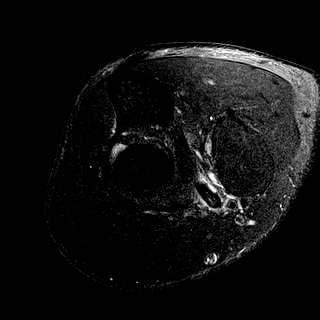
[im 14/27]
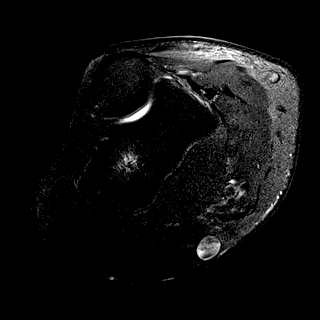
[im 20/27]
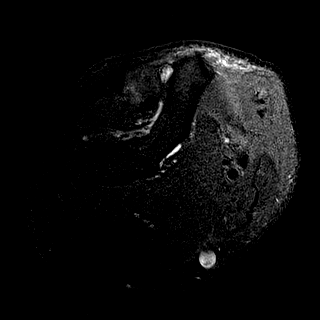
[im 27/27]
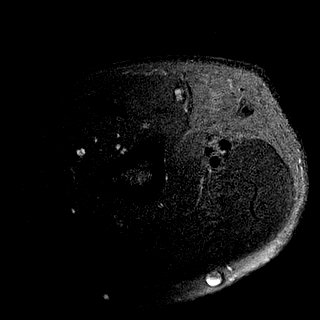

[Series 5: T2 fat-sat · coronal · left · 3.0mm · 0.44mm/px · 5 of 30 slices shown (2 of 2)]
[im 1/30]
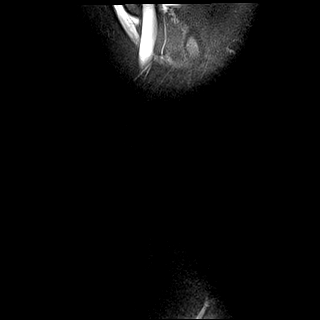
[im 8/30]
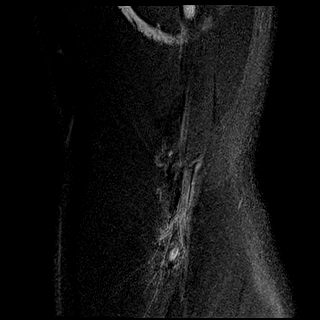
[im 15/30]
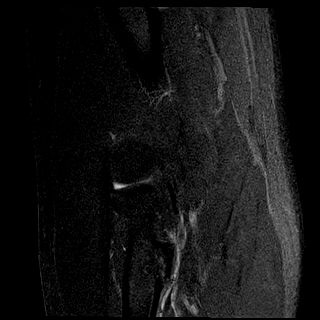
[im 22/30]
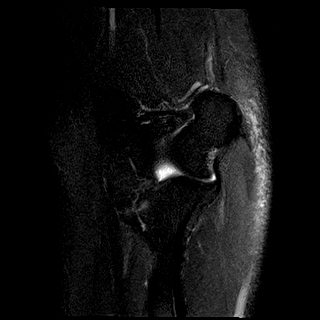
[im 30/30]
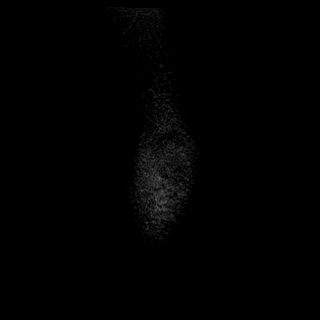

[Series 6: PD fat-sat · sagittal · left · 3.2mm · 0.55mm/px · 6 of 34 slices shown]
[im 1/34]
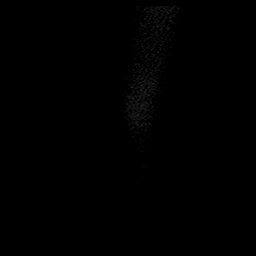
[im 7/34]
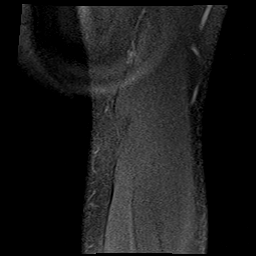
[im 14/34]
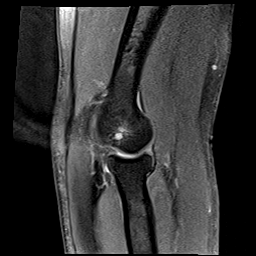
[im 20/34]
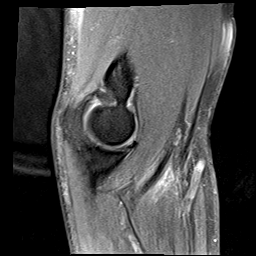
[im 27/34]
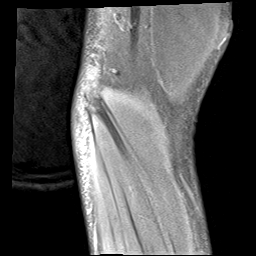
[im 34/34]
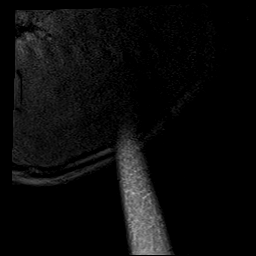

[Series 7: T1 fat-sat · axial · non-contrast · left · 3.5mm · 0.38mm/px · z∈[-39,+59]mm · 5 of 27 slices shown]
[im 1/27]
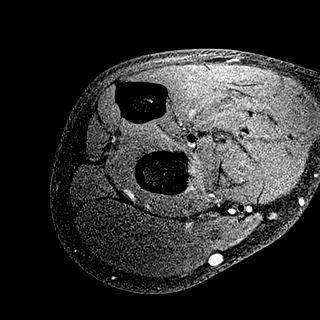
[im 7/27]
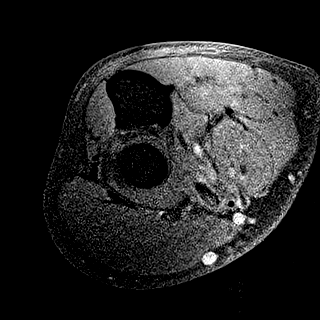
[im 14/27]
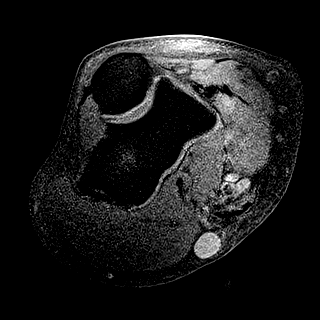
[im 20/27]
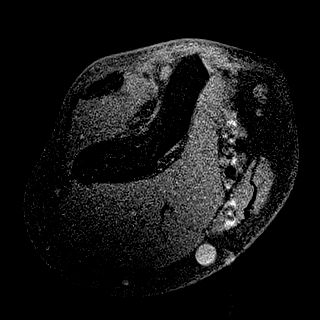
[im 27/27]
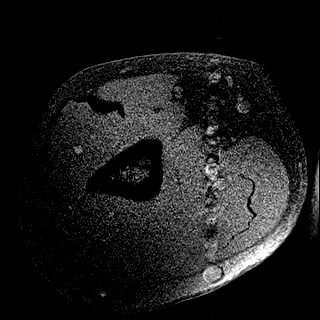

[Series 8: T1 fat-sat post-contrast · axial · left · 3.5mm · 0.47mm/px · z∈[-39,+59]mm · 5 of 27 slices shown (1 of 3)]
[im 1/27]
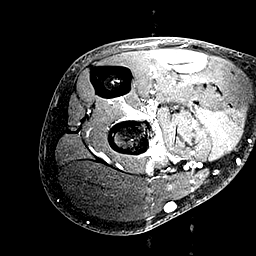
[im 7/27]
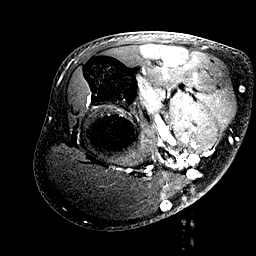
[im 14/27]
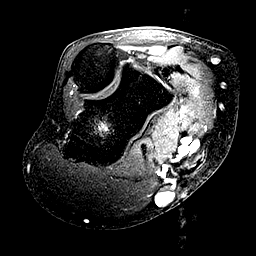
[im 20/27]
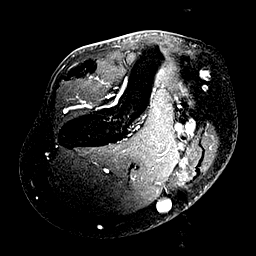
[im 27/27]
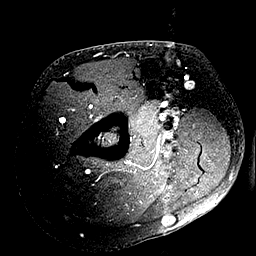

[Series 9: T1 fat-sat post-contrast · coronal · left · 3.0mm · 0.55mm/px · 5 of 30 slices shown (2 of 3)]
[im 1/30]
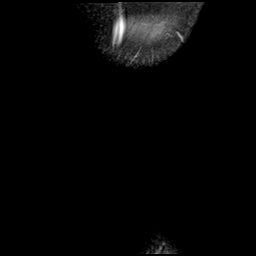
[im 8/30]
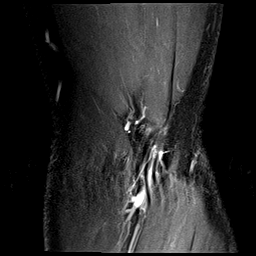
[im 15/30]
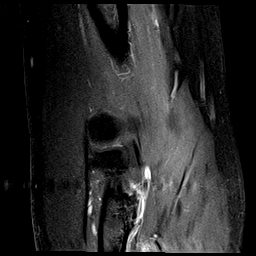
[im 22/30]
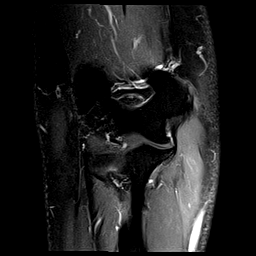
[im 30/30]
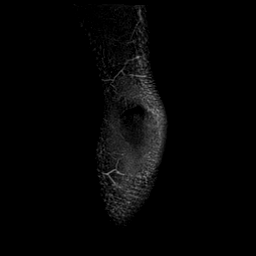

[Series 10: T1 fat-sat post-contrast · sagittal · left · 3.2mm · 0.55mm/px · 4 of 23 slices shown (3 of 3)]
[im 1/23]
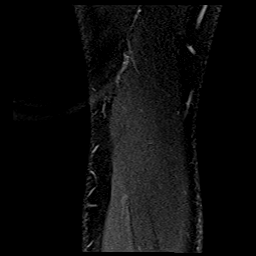
[im 8/23]
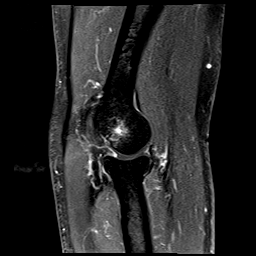
[im 15/23]
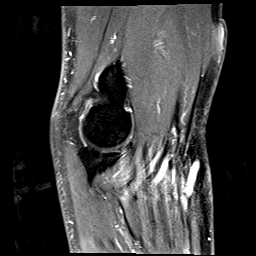
[im 23/23]
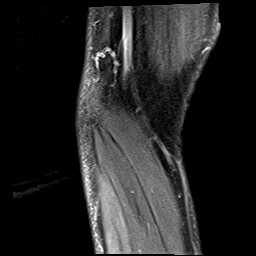

[40 of 40 positions shown; findings below may reference images not displayed]

FINDINGS: TENDONS

Common forearm flexor origin: Intact.

Common forearm extensor origin: Thickened with small partial tear.

Biceps: Severe distal biceps tendinosis with high-grade partial tear
involving 50% of the tendon insertion.

Triceps: Intact.

LIGAMENTS

Medial stabilizers: The ulnar collateral ligament is intact.

Lateral stabilizers: The lateral ulnar and radial collateral
ligaments are intact.

Cartilage: Preserved.  No focal chondral defect demonstrated.

Joint: No joint effusion or intra-articular body observed.

Cubital tunnel: Unremarkable.  The ulnar nerve appears normal.

Bones: Small amount of reactive marrow edema in the lateral condyle.
No acute fracture or dislocation. No suspicious bone lesion.

Other: None.
IMPRESSION: 1. Severe distal biceps tendinosis with high-grade partial tear
involving 50% of the tendon insertion.
2. Mild common extensor tendinosis with small partial tear.

## 2022-06-28 DIAGNOSIS — Z85528 Personal history of other malignant neoplasm of kidney: Secondary | ICD-10-CM | POA: Diagnosis not present

## 2022-06-28 DIAGNOSIS — Z08 Encounter for follow-up examination after completed treatment for malignant neoplasm: Secondary | ICD-10-CM | POA: Diagnosis not present

## 2022-06-28 DIAGNOSIS — C641 Malignant neoplasm of right kidney, except renal pelvis: Secondary | ICD-10-CM | POA: Diagnosis not present

## 2022-07-01 DIAGNOSIS — I10 Essential (primary) hypertension: Secondary | ICD-10-CM | POA: Diagnosis not present

## 2022-07-01 DIAGNOSIS — C641 Malignant neoplasm of right kidney, except renal pelvis: Secondary | ICD-10-CM | POA: Diagnosis not present

## 2022-07-21 DIAGNOSIS — Z6832 Body mass index (BMI) 32.0-32.9, adult: Secondary | ICD-10-CM | POA: Diagnosis not present

## 2022-07-21 DIAGNOSIS — R0789 Other chest pain: Secondary | ICD-10-CM | POA: Diagnosis not present

## 2022-07-21 DIAGNOSIS — R079 Chest pain, unspecified: Secondary | ICD-10-CM | POA: Diagnosis not present

## 2022-07-24 DIAGNOSIS — R0789 Other chest pain: Secondary | ICD-10-CM | POA: Diagnosis not present

## 2023-12-27 NOTE — Progress Notes (Signed)
 Attempted to call Tyrone Wilson to discuss returning for scans, labs, visit. Unable to reach him. Communicated via mychart instead. Pt gets scans at [facility] or [facility]. Orders placed to be done at [facility] (next available), with return to review. Will arrange follow up once date for CT is confirmed. Pre-meds sent to his local pharmacy for contrast allergy.

## 2024-01-17 NOTE — Progress Notes (Signed)
 GU ONCOLOGY CLINIC NOTE  Encounter Date: 01/17/2024  Patient Name: Tyrone Wilson Patient Age: 58 y.o.   Assessment/Plan: Tyrone Wilson is a 58 y.o. male with hypertension, gallstones, anemia and CKD who is here in follow-up of metastatic RCC  #. Metastatic clear cell RCC: To lung, LN. He has intermediate risk disease based on anemia, time to systemic therapy. S/p right cytoreductive nephrectomy 12/03/21 with gr 4, [id number] ccRCC. He is s/p 2 cycles of ipi/nivo per the PDIGREE trial. Trial discontinued due to grade 2 pericarditis and grade 1 pneumonitis.  He has now completed steroids and colchicine, and we have elected to monitor for now. We can start TKI if POD is noted.  --Last imaging on 01/17/2024 reviewed today with continued NED  [id number] Consented: N  TEMPUS Consented: N Results:  #. Immune related Adverse reaction-pneumonitis/pericarditis (Grade2): Seen by cardiology and agreed consistent with pericarditis. Unlikely signficant myocarditis. No additional Ipi/Nivo.  -- follows with cardiology-- seeing next month (3/24) --Completed colchicine taper -- completed HDS   #. CKD Patient's current creatinine could either be secondary to longstanding hypertension and chronic kidney disease or perhaps related to the right-sided hydronephrosis. Stable post nephrectomy  #. HTN DC'd valsartan  recently; started amlodipine BP stable today  #. S/P Gastric Sleeve Stable, not acute issue currently Intermittent diarrhea  #. Macropapular rash: R/T Ipi + Nivo (grade 1) confined to chest; Resolved  #. Pruritis: resolved   #. Social: Anxiety and stress better managed at this time.   Plan: -- RTC in mon 6-73mon with labs + imaging  (@[facility]) --Ordered contrast premeds     The patient reports they are physically located in [place]  and is currently: at home. I conducted a audio/video visit. I spent  32m 16s on the video call with the patient. I spent an additional 20  minutes on pre- and post-visit activities on the date of service .          Hematology/Oncology History Overview Note  TREATMENT TEAM PCP [facility] UROLOGIST: Levander Cheadle   TREATMENT HISTORY 10/23/2021 CT A/P Right kidney mass measuring approximately 8.7 x 7.4 x 9.8 cm, involves the renal hilum and mass effect on the collecting system Right-sided moderate hydronephrosis 1.5 cm porta caval lymph node  11/04/2021 CT C 1.0 cm right anterior upper lobe nodule Subcarinal lymph node, potentially centrally necrotic, measuring 2.3 cm.  12/03/2021 Radical Nephrectomy A: Kidney and adrenal, right, radical nephrectomy - Clear cell renal cell carcinoma, ISUP nucleolar grade 4, with necrosis (30%), rhabdoid (5%) and sarcomatoid (15%) features. - Tumor size 7.6 cm - Tumor invades into perinephric adipose tissue, extensively invades into renal sinus, involves renal vein, involves pelvicalyceal system, involves wall of ureter and ulcerates ureteral mucosa. - Surgical margins negative (but close at Gerota's fascia, 1mm and posterior perinephric fat, 1mm) - No tumor seen in renal hilar lymph node (0/1) - Nodule of metastatic renal cell carcinoma involving right adrenal, size of metastasis 1.0 cm -[id number]    Renal cell carcinoma of right kidney  11/12/2021 Initial Diagnosis   Renal cell carcinoma of right kidney (CMS-HCC)   11/13/2021 -  Cancer Staged   Staging form: Kidney, AJCC 8th Edition - Clinical: Stage IV (cT2, cN0, cM1) - Signed by Elsie Othella Cramp, MD on 11/13/2021    01/04/2022 -  Chemotherapy   STUDY [id number] IRB# [id number] INDUCTION (v. 10/23/21) PD-inhibitor (Nivolumab) and Ipilimumab followed by nivolumab vs. VEGF TKI cabozantinib with nivolumab in metastatic untreated REnal Cell CancEr [PDIGREE]  History of Present Illness: Tyrone Wilson is a 58 y.o. male with HTN, s/p gastric sleeve who is seen in follow-up of metastatic ccRCC.  Began having pain in RUQ abd in early Jan 2023.   He describes the pain as relatively constant, feeling like he had pulled a muscle, relatively nonradiating, in general approximately a 3/10 in terms of intensity, felt like he had in the past with muscle strains. Notices it more when lying on RIGHT side.   This was followed up with a work-up that led to discovery of a renal mass on the right.  On 10/23/2021 patient had a CT scan of the abdomen and pelvis which revealed a right renal mass measuring approximately 9.8 cm in greatest dimension, there was associated right-sided hydronephrosis and a 1.5 cm portacaval lymph node.  A follow-up CT scan dated 11/04/2021 showed a 2.3 cm subcarinal lymph node that appeared centrally necrotic.  He underwent radical nephrectomy on 12/03/2021, and has now started adjuvant treatment with Ipilimumb + Nivolumab, received C2 on 01/25/22. Recently, he developed pleuritic chest pain c/w pericarditis.  Interval History: 01/17/2024 He is feeling well today. No new issues or concerns. No CP or SOB. Mild fatigue, but working with his church building handicap ramps    ALLERGIES: has no known allergies.  MEDICATIONS: Current Outpatient Medications  Medication Instructions  . amlodipine (NORVASC) 5 mg, Oral, Daily (standard), TAKE 1 TABLET(5 MG) BY MOUTH DAILY  . docusate sodium (COLACE) 100 mg, Oral, 2 times a day (standard)  . miscellaneous medical supply (BLOOD PRESSURE CUFF) Misc 1 Box, Miscellaneous, Daily  . multivitamin (TAB-A-VITE/THERAGRAN) per tablet 1 tablet, Oral, Daily (standard)  . omeprazole (PRILOSEC) 20 mg, Oral, Daily (standard)  . predniSONE (DELTASONE) 50 mg, Oral, Daily (standard), Take one tablet 13 hours, one tablet 7 hours and one tablet 1 hour prior to scan  . rosuvastatin  (CRESTOR ) 10 mg, Oral, Daily (standard)    MEDICAL HISTORY: Past Medical History:  Diagnosis Date  . GERD (gastroesophageal reflux disease)   . HTN (hypertension)   . Myopericarditis   . Renal cell carcinoma of right kidney      SURGICAL HISTORY: Past Surgical History:  Procedure Laterality Date  . Gastric sleeve    . PR LAP, RADICAL NEPHRECTOMY Right 12/03/2021   Procedure: ROBOTIC XI LAPAROSCOPY; RADICAL NEPHRECTOMY (INCL REMOVE GEROTA`S FASCIA, FATTY TISSUE, REG LYMPH NODE, ADRENALECTOMY);  Surgeon: Catherene Cheadle, MD;  Location: MAIN OR [facility];  Service: Urology  . PR LAP,CHOLECYSTECTOMY N/A 12/03/2021   Procedure: ROBOTIC XI LAPAROSCOPY, SURGICAL; CHOLECYSTECTOMY;  Surgeon: Almarie Myrick Cos, MD;  Location: MAIN OR [facility];  Service: Surgical Oncology    SOCIAL HISTORY:  Social History   Socioeconomic History  . Marital status: Married  Tobacco Use  . Smoking status: Never    Passive exposure: Never  . Smokeless tobacco: Never  Vaping Use  . Vaping status: Never Used  Substance and Sexual Activity  . Alcohol use: Not Currently  . Drug use: Never  . Sexual activity: Not Currently    Partners: Female  Social History Narrative   Worked as a Scientific laboratory technician.    Social Drivers of Health   Financial Resource Strain: Low Risk  (12/01/2023)   Overall Financial Resource Strain (CARDIA)   . Difficulty of Paying Living Expenses: Not hard at all  Food Insecurity: No Food Insecurity (12/01/2023)   Hunger Vital Sign   . Worried About Programme researcher, broadcasting/film/video in the Last Year: Never true   . Ran Out of  Food in the Last Year: Never true  Transportation Needs: No Transportation Needs (12/01/2023)   PRAPARE - Transportation   . Lack of Transportation (Medical): No   . Lack of Transportation (Non-Medical): No  Physical Activity: Inactive (07/20/2021)   Received from [facility] [place]   Exercise Vital Sign   . Days of Exercise per Week: 0 days   . Minutes of Exercise per Session: 0 min  Stress: No Stress Concern Present (07/20/2021)   Received from [facility], [facility]   [facility] - Occupational Stress Questionnaire   . Feeling of Stress : Not at all  Social  Connections: Unknown (07/20/2021)   Received from [facility], [facility]   Social Connection and Isolation Panel [NHANES]   . Marital Status: Married  Housing: Low Risk  (12/01/2023)   Housing   . Within the past 12 months, have you ever stayed: outside, in a car, in a tent, in an overnight shelter, or temporarily in someone else's home (i.e. couch-surfing)?: No   . Are you worried about losing your housing?: No  Christian.  FAMILY HISTORY: family history includes Cancer in his brother and sister.   Physical Exam: Looks well on video  No comprehensive exam 2/2 video visit   Karnofsky/Lansky Performance Status 100, Fully active, able to carry on all pre-disease performed without restriction (ECOG equivalent 0)  Lab Results: Lab Results  Component Value Date   WBC 8.3 01/06/2024   HGB 14.6 01/06/2024   HCT 43.3 01/06/2024   PLT 204 01/06/2024    Lab Results  Component Value Date   NA 139 01/06/2024   K 4.2 01/06/2024   CL 106 01/06/2024   CO2 24.0 01/06/2024   BUN 20 01/06/2024   CREATININE 1.40 (H) 01/06/2024   GLU 125 (H) 01/06/2024   CALCIUM  9.1 01/06/2024    Lab Results  Component Value Date   BILITOT 0.6 01/06/2024   PROT 7.3 01/06/2024   ALBUMIN 4.5 01/06/2024   ALT 25 01/06/2024   AST 39 01/06/2024   ALKPHOS 119 01/06/2024    Lab Results  Component Value Date   INR 1.03 07/21/2022   APTT 31.1 02/04/2022     Imaging Results: CT Abdomen Pelvis W Contrast Result Date: 01/06/2024  Impression: No evidence of local recurrence or metastatic disease within the abdomen or pelvis. Subtle mesenteric fat stranding with prominent mesenteric lymph nodes which is unchanged from prior and nonspecific however can be seen with sclerosing mesenteritis. Additional chronic and incidental findings detailed in the body of the report.   CT Chest W Contrast Result Date: 01/06/2024  Impression: No thoracic metastatic disease.     06/28/2022 CT CAP  - Redemonstrated  sequelae of prior right-sided nephrectomy without evidence to suggest locally recurrent or distant metastatic disease throughout the abdomen or pelvis (within aforementioned limitations of exam).   12/24/21 CT CAP: -Redemonstrated multiple bilateral pulmonary nodules measuring up to 1 cm in the right upper lobe. Findings are unchanged in size and distribution compared to prior. No new or enlarging pulmonary nodules. -Centrally necrotic subcarinal lymph node again noted without much change. -New operative changes of right nephrectomy, adrenalectomy and cholecystectomy. No new sites of metastatic disease in the abdomen or pelvis.    Pathology:  A: Kidney and adrenal, right, radical nephrectomy - Clear cell renal cell carcinoma, ISUP nucleolar grade 4, with necrosis (30%), rhabdoid (5%) and sarcomatoid (15%) features. - Tumor size 7.6 cm - Tumor invades into perinephric adipose tissue,  extensively invades into renal sinus, involves renal vein, involves pelvicalyceal system, involves wall of ureter and ulcerates ureteral mucosa. - Surgical margins negative (but close at Gerota's fascia, 1mm and posterior perinephric fat, 1mm) - No tumor seen in renal hilar lymph node (0/1) - Nodule of metastatic renal cell carcinoma involving right adrenal, size of metastasis 1.0 cm   B: Gallbladder, cholecystectomy - Cholelithiasis - Mild chronic cholecystitis  - Cholesterolosis - No evidence of malignancy
